# Patient Record
Sex: Female | Born: 1935 | Race: White | Hispanic: No | Marital: Single | Smoking: Former smoker
Health system: Southern US, Community
[De-identification: ages and names within clinical notes are randomized; demographics above are authoritative.]

## PROBLEM LIST (undated history)

## (undated) DIAGNOSIS — I10 Essential (primary) hypertension: Secondary | ICD-10-CM

## (undated) DIAGNOSIS — F0390 Unspecified dementia without behavioral disturbance: Secondary | ICD-10-CM

## (undated) DIAGNOSIS — M1990 Unspecified osteoarthritis, unspecified site: Secondary | ICD-10-CM

## (undated) HISTORY — PX: CHOLECYSTECTOMY: SHX55

---

## 2018-03-13 ENCOUNTER — Encounter (HOSPITAL_COMMUNITY): Payer: Self-pay

## 2018-03-13 ENCOUNTER — Inpatient Hospital Stay (HOSPITAL_COMMUNITY)
Admission: EM | Admit: 2018-03-13 | Discharge: 2018-03-19 | DRG: 065 | Disposition: A | Discharge status: Skilled Nursing Facility | Payer: Medicare Other | Attending: Internal Medicine | Admitting: Internal Medicine

## 2018-03-13 ENCOUNTER — Emergency Department (HOSPITAL_COMMUNITY): Payer: Medicare Other

## 2018-03-13 DIAGNOSIS — E785 Hyperlipidemia, unspecified: Secondary | ICD-10-CM | POA: Diagnosis present

## 2018-03-13 DIAGNOSIS — I6340 Cerebral infarction due to embolism of unspecified cerebral artery: Principal | ICD-10-CM | POA: Diagnosis present

## 2018-03-13 DIAGNOSIS — R4182 Altered mental status, unspecified: Secondary | ICD-10-CM | POA: Diagnosis present

## 2018-03-13 DIAGNOSIS — L8990 Pressure ulcer of unspecified site, unspecified stage: Secondary | ICD-10-CM

## 2018-03-13 DIAGNOSIS — R29702 NIHSS score 2: Secondary | ICD-10-CM | POA: Diagnosis present

## 2018-03-13 DIAGNOSIS — Z87891 Personal history of nicotine dependence: Secondary | ICD-10-CM

## 2018-03-13 DIAGNOSIS — I639 Cerebral infarction, unspecified: Secondary | ICD-10-CM

## 2018-03-13 DIAGNOSIS — G4710 Hypersomnia, unspecified: Secondary | ICD-10-CM | POA: Diagnosis present

## 2018-03-13 DIAGNOSIS — Z66 Do not resuscitate: Secondary | ICD-10-CM | POA: Diagnosis present

## 2018-03-13 DIAGNOSIS — L89312 Pressure ulcer of right buttock, stage 2: Secondary | ICD-10-CM | POA: Diagnosis present

## 2018-03-13 DIAGNOSIS — R400 Somnolence: Secondary | ICD-10-CM

## 2018-03-13 DIAGNOSIS — Q211 Atrial septal defect: Secondary | ICD-10-CM

## 2018-03-13 DIAGNOSIS — I10 Essential (primary) hypertension: Secondary | ICD-10-CM

## 2018-03-13 DIAGNOSIS — Z79899 Other long term (current) drug therapy: Secondary | ICD-10-CM

## 2018-03-13 DIAGNOSIS — Z7982 Long term (current) use of aspirin: Secondary | ICD-10-CM

## 2018-03-13 DIAGNOSIS — F0390 Unspecified dementia without behavioral disturbance: Secondary | ICD-10-CM

## 2018-03-13 HISTORY — DX: Essential (primary) hypertension: I10

## 2018-03-13 HISTORY — DX: Unspecified osteoarthritis, unspecified site: M19.90

## 2018-03-13 HISTORY — DX: Unspecified dementia, unspecified severity, without behavioral disturbance, psychotic disturbance, mood disturbance, and anxiety: F03.90

## 2018-03-13 LAB — I-STAT TROPONIN, ED: Troponin i, poc: 0.05 ng/mL (ref 0.00–0.08)

## 2018-03-13 LAB — CBC WITH DIFFERENTIAL/PLATELET
ABS IMMATURE GRANULOCYTES: 0.05 10*3/uL (ref 0.00–0.07)
BASOS ABS: 0 10*3/uL (ref 0.0–0.1)
Basophils Relative: 0 %
Eosinophils Absolute: 0.1 10*3/uL (ref 0.0–0.5)
Eosinophils Relative: 1 %
HCT: 38.9 % (ref 36.0–46.0)
HEMOGLOBIN: 12.8 g/dL (ref 12.0–15.0)
IMMATURE GRANULOCYTES: 0 %
LYMPHS ABS: 2.6 10*3/uL (ref 0.7–4.0)
LYMPHS PCT: 22 %
MCH: 31.7 pg (ref 26.0–34.0)
MCHC: 32.9 g/dL (ref 30.0–36.0)
MCV: 96.3 fL (ref 80.0–100.0)
MONOS PCT: 10 %
Monocytes Absolute: 1.1 10*3/uL — ABNORMAL HIGH (ref 0.1–1.0)
NEUTROS PCT: 67 %
NRBC: 0 % (ref 0.0–0.2)
Neutro Abs: 7.5 10*3/uL (ref 1.7–7.7)
Platelets: 312 10*3/uL (ref 150–400)
RBC: 4.04 MIL/uL (ref 3.87–5.11)
RDW: 12.4 % (ref 11.5–15.5)
WBC: 11.4 10*3/uL — ABNORMAL HIGH (ref 4.0–10.5)

## 2018-03-13 LAB — URINALYSIS, ROUTINE W REFLEX MICROSCOPIC
Bilirubin Urine: NEGATIVE
GLUCOSE, UA: NEGATIVE mg/dL
Hgb urine dipstick: NEGATIVE
Ketones, ur: NEGATIVE mg/dL
Leukocytes, UA: NEGATIVE
Nitrite: NEGATIVE
PROTEIN: NEGATIVE mg/dL
Specific Gravity, Urine: 1.012 (ref 1.005–1.030)
pH: 6 (ref 5.0–8.0)

## 2018-03-13 LAB — COMPREHENSIVE METABOLIC PANEL
ALBUMIN: 3.5 g/dL (ref 3.5–5.0)
ALT: 18 U/L (ref 0–44)
ANION GAP: 9 (ref 5–15)
AST: 19 U/L (ref 15–41)
Alkaline Phosphatase: 65 U/L (ref 38–126)
BILIRUBIN TOTAL: 0.5 mg/dL (ref 0.3–1.2)
BUN: 19 mg/dL (ref 8–23)
CO2: 23 mmol/L (ref 22–32)
Calcium: 9.1 mg/dL (ref 8.9–10.3)
Chloride: 106 mmol/L (ref 98–111)
Creatinine, Ser: 0.98 mg/dL (ref 0.44–1.00)
GFR calc Af Amer: >60 mL/min (ref >60)
GFR calc non Af Amer: 53 mL/min — ABNORMAL LOW (ref >60)
GLUCOSE: 109 mg/dL — AB (ref 70–99)
POTASSIUM: 4.4 mmol/L (ref 3.5–5.1)
SODIUM: 138 mmol/L (ref 135–145)
TOTAL PROTEIN: 7 g/dL (ref 6.5–8.1)

## 2018-03-13 LAB — CBG MONITORING, ED: Glucose-Capillary: 99 mg/dL (ref 70–99)

## 2018-03-13 LAB — PROTIME-INR
INR: 1.01
Prothrombin Time: 13.2 seconds (ref 11.4–15.2)

## 2018-03-13 MED ORDER — ENOXAPARIN SODIUM 40 MG/0.4ML ~~LOC~~ SOLN
40.0000 mg | SUBCUTANEOUS | Status: DC
  Administered 2018-03-14 – 2018-03-18 (×4): 40 mg via SUBCUTANEOUS
  Filled 2018-03-13 (×5): qty 0.4

## 2018-03-13 MED ORDER — ACETAMINOPHEN 650 MG RE SUPP: 650.0000 mg | Freq: Four times a day (QID) | RECTAL | Status: DC | PRN

## 2018-03-13 MED ORDER — ACETAMINOPHEN 325 MG PO TABS
650.0000 mg | ORAL_TABLET | Freq: Four times a day (QID) | ORAL | Status: DC | PRN
  Administered 2018-03-15: 650 mg via ORAL
  Filled 2018-03-13 (×3): qty 2

## 2018-03-13 MED ORDER — ASPIRIN EC 81 MG PO TBEC
81.0000 mg | DELAYED_RELEASE_TABLET | Freq: Every day | ORAL | Status: DC
  Administered 2018-03-14: 81 mg via ORAL
  Filled 2018-03-13: qty 1

## 2018-03-13 MED ORDER — SODIUM CHLORIDE 0.9% FLUSH
3.0000 mL | Freq: Two times a day (BID) | INTRAVENOUS | Status: DC
  Administered 2018-03-14 – 2018-03-18 (×9): 3 mL via INTRAVENOUS

## 2018-03-13 MED ORDER — SODIUM CHLORIDE 0.9 % IV SOLN
INTRAVENOUS | Status: AC
  Administered 2018-03-13: via INTRAVENOUS

## 2018-03-13 MED ORDER — SODIUM CHLORIDE 0.9 % IV BOLUS
1000.0000 mL | Freq: Once | INTRAVENOUS | Status: AC
  Administered 2018-03-13: 1000 mL via INTRAVENOUS

## 2018-03-13 NOTE — ED Provider Notes (Signed)
[facility] EMERGENCY DEPARTMENT Provider Note   CSN: [id number] Arrival date & time: 03/13/18  1550     History   Chief Complaint No chief complaint on file.   HPI Nina Bell is a 83 y.o. female.  Level 5 caveat limited historian secondary to altered mental status.  Patient reportedly had a fall at her facility yesterday.  Since then she is been altered and not been ambulatory.  Patient herself denies any complaints and when asked why she was here she said to get some blood work done.  She does not recall the fall.  She is oriented to the fact that it is Thursday and Halloween.  The history is provided by the patient and the EMS personnel. The history is limited by the condition of the patient.  Altered Mental Status   This is a new problem. The current episode started yesterday. The problem has not changed since onset.Associated symptoms include confusion.    No past medical history on file.  There are no active problems to display for this patient.   Past Surgical History:  Procedure Laterality Date  . CHOLECYSTECTOMY       OB History   None      Home Medications    Prior to Admission medications   Not on File    Family History No family history on file.  Social History Social History   Tobacco Use  . Smoking status: Not on file  Substance Use Topics  . Alcohol use: Not on file  . Drug use: Not on file     Allergies   Patient has no allergy information on record.   Review of Systems Review of Systems  Unable to perform ROS: Mental status change  Constitutional: Negative for fever.  HENT: Negative for sore throat.   Eyes: Negative for visual disturbance.  Respiratory: Negative for shortness of breath.   Cardiovascular: Negative for chest pain.  Gastrointestinal: Negative for abdominal pain.  Genitourinary: Negative for dysuria.  Skin: Negative for rash.  Psychiatric/Behavioral: Positive for confusion.     Physical  Exam Updated Vital Signs BP (!) 145/53   Pulse 86   Temp 98.9 F (37.2 C) (Oral)   Resp 20   SpO2 99%   Physical Exam  Constitutional: She appears well-developed and well-nourished. No distress.  HENT:  Head: Normocephalic and atraumatic.  Right Ear: External ear normal.  Left Ear: External ear normal.  Nose: Nose normal.  Mouth/Throat: Oropharynx is clear and moist.  Eyes: Conjunctivae are normal.  Neck: Neck supple.  Cardiovascular: Normal rate, regular rhythm, normal heart sounds and intact distal pulses.  No murmur heard. Pulmonary/Chest: Effort normal and breath sounds normal. No respiratory distress.  Abdominal: Soft. She exhibits no mass. There is no tenderness. There is no guarding.  Musculoskeletal: Normal range of motion. She exhibits no edema, tenderness or deformity.  Neurological: She is alert. She has normal strength. She is disoriented (to place). No sensory deficit. GCS eye subscore is 4. GCS verbal subscore is 5. GCS motor subscore is 6.  Skin: Skin is warm and dry. Capillary refill takes less than 2 seconds.  Psychiatric: She has a normal mood and affect.  Nursing note and vitals reviewed.    ED Treatments / Results  Labs (all labs ordered are listed, but only abnormal results are displayed) Labs Reviewed  CBC WITH DIFFERENTIAL/PLATELET - Abnormal; Notable for the following components:      Result Value   WBC 11.4 (*)  Monocytes Absolute 1.1 (*)    All other components within normal limits  COMPREHENSIVE METABOLIC PANEL - Abnormal; Notable for the following components:   Glucose, Bld 109 (*)    GFR calc non Af Amer 53 (*)    All other components within normal limits  TROPONIN I - Abnormal; Notable for the following components:   Troponin I 0.07 (*)    All other components within normal limits  CBC - Abnormal; Notable for the following components:   RBC 3.65 (*)    Hemoglobin 11.4 (*)    HCT 34.3 (*)    All other components within normal limits    BASIC METABOLIC PANEL - Abnormal; Notable for the following components:   Calcium 8.3 (*)    All other components within normal limits  TROPONIN I - Abnormal; Notable for the following components:   Troponin I 0.06 (*)    All other components within normal limits  MRSA PCR SCREENING  URINALYSIS, ROUTINE W REFLEX MICROSCOPIC  PROTIME-INR  TSH  CBG MONITORING, ED  I-STAT TROPONIN, ED    EKG EKG Interpretation  Date/Time:  Thursday March 13 2018 15:54:29 EDT Ventricular Rate:  86 PR Interval:    QRS Duration: 86 QT Interval:  390 QTC Calculation: 467 R Axis:   -6 Text Interpretation:  Sinus rhythm Anterior infarct, old Minimal ST depression, lateral leads no prior to compare with Confirmed by Meridee Score [id number]) on 03/13/2018 4:07:38 PM   Radiology Dg Chest 1 View  Result Date: 03/13/2018 CLINICAL DATA:  Altered mental status, fell yesterday, disorientation EXAM: CHEST  1 VIEW COMPARISON:  None. FINDINGS: No active infiltrate or effusion is seen. Mediastinal and hilar contours are unremarkable. The heart is mildly enlarged. No bony abnormality is seen. A small hiatal hernia cannot be excluded. IMPRESSION: 1. No active lung disease. 2. Borderline cardiomegaly. Electronically Signed   By: Dwyane Dee M.D.   On: 03/13/2018 17:04   Ct Head Wo Contrast  Result Date: 03/13/2018 CLINICAL DATA:  Fall EXAM: CT HEAD WITHOUT CONTRAST CT CERVICAL SPINE WITHOUT CONTRAST TECHNIQUE: Multidetector CT imaging of the head and cervical spine was performed following the standard protocol without intravenous contrast. Multiplanar CT image reconstructions of the cervical spine were also generated. COMPARISON:  None. FINDINGS: CT HEAD FINDINGS Brain: There is no mass, hemorrhage or extra-axial collection. There is generalized atrophy without lobar predilection. There is hypoattenuation of the periventricular white matter, most commonly indicating chronic ischemic microangiopathy. Vascular:  Atherosclerotic calcification of the internal carotid arteries at the skull base. No abnormal hyperdensity of the major intracranial arteries or dural venous sinuses. Skull: The visualized skull base, calvarium and extracranial soft tissues are normal. Sinuses/Orbits: No fluid levels or advanced mucosal thickening of the visualized paranasal sinuses. No mastoid or middle ear effusion. The orbits are normal. CT CERVICAL SPINE FINDINGS Alignment: No static subluxation. Facets are aligned. Occipital condyles are normally positioned. Skull base and vertebrae: No acute fracture. Soft tissues and spinal canal: No prevertebral fluid or swelling. No visible canal hematoma. Disc levels: No advanced spinal canal or neural foraminal stenosis. Upper chest: No pneumothorax, pulmonary nodule or pleural effusion. Other: Normal visualized paraspinal cervical soft tissues. IMPRESSION: 1. No acute abnormality of the head or cervical spine. 2. Chronic small vessel ischemia and cerebral volume loss. Electronically Signed   By: Deatra Robinson M.D.   On: 03/13/2018 17:58   Ct Cervical Spine Wo Contrast  Result Date: 03/13/2018 CLINICAL DATA:  Fall EXAM: CT HEAD WITHOUT CONTRAST CT  CERVICAL SPINE WITHOUT CONTRAST TECHNIQUE: Multidetector CT imaging of the head and cervical spine was performed following the standard protocol without intravenous contrast. Multiplanar CT image reconstructions of the cervical spine were also generated. COMPARISON:  None. FINDINGS: CT HEAD FINDINGS Brain: There is no mass, hemorrhage or extra-axial collection. There is generalized atrophy without lobar predilection. There is hypoattenuation of the periventricular white matter, most commonly indicating chronic ischemic microangiopathy. Vascular: Atherosclerotic calcification of the internal carotid arteries at the skull base. No abnormal hyperdensity of the major intracranial arteries or dural venous sinuses. Skull: The visualized skull base, calvarium and  extracranial soft tissues are normal. Sinuses/Orbits: No fluid levels or advanced mucosal thickening of the visualized paranasal sinuses. No mastoid or middle ear effusion. The orbits are normal. CT CERVICAL SPINE FINDINGS Alignment: No static subluxation. Facets are aligned. Occipital condyles are normally positioned. Skull base and vertebrae: No acute fracture. Soft tissues and spinal canal: No prevertebral fluid or swelling. No visible canal hematoma. Disc levels: No advanced spinal canal or neural foraminal stenosis. Upper chest: No pneumothorax, pulmonary nodule or pleural effusion. Other: Normal visualized paraspinal cervical soft tissues. IMPRESSION: 1. No acute abnormality of the head or cervical spine. 2. Chronic small vessel ischemia and cerebral volume loss. Electronically Signed   By: Deatra Robinson M.D.   On: 03/13/2018 17:58   Mr Brain Wo Contrast  Result Date: 03/14/2018 CLINICAL DATA:  Initial evaluation for acute altered mental status. The EXAM: MRI HEAD WITHOUT CONTRAST TECHNIQUE: Multiplanar, multiecho pulse sequences of the brain and surrounding structures were obtained without intravenous contrast. COMPARISON:  Prior CT from 03/13/2018. FINDINGS: Brain: Examination mildly degraded by motion artifact. Diffuse prominence of the CSF containing spaces compatible with generalized cerebral atrophy, mildly advanced in nature. Confluent T2/FLAIR hyperintensity within the periventricular white matter most consistent with chronic small vessel ischemic disease, mild for age. There is minimal patchy diffusion abnormality involving the cortical and subcortical white matter within the right frontal lobe (series 5, image 81), consistent with small acute right MCA territory infarcts. A few additional punctate foci of diffusion abnormality within the bilateral cerebellar hemispheres (series 5, image 54, 53, 46), also consistent with acute/early subacute ischemic infarcts. Findings are likely embolic in  nature. No associated hemorrhage or mass effect. No other evidence for acute or subacute ischemia. Gray-white matter differentiation otherwise maintained. No acute or chronic intracranial hemorrhage. No mass lesion, midline shift or mass effect. Diffuse ventricular prominence most like related global parenchymal volume loss. A degree of underlying NPH would be difficult to exclude. No extra-axial fluid collection. Pituitary gland within normal limits. Vascular: Major intracranial vascular flow voids maintained. Skull and upper cervical spine: Craniocervical junction normal. Upper cervical spine within normal limits. Bone marrow signal intensity normal. No scalp soft tissue abnormality. Sinuses/Orbits: Patient status post ocular lens replacement bilaterally. Paranasal sinuses are clear. Trace opacity left mastoid air cells, of doubtful significance. Other: End. IMPRESSION: 1. Few scattered patchy small volume acute to early subacute ischemic infarcts involving the cortical subcortical right frontal lobe as well as the bilateral cerebellar hemispheres as above. Findings likely embolic in nature. No associated hemorrhage. 2. No other acute intracranial abnormality. 3. Mildly advanced cerebral atrophy with chronic small vessel ischemic disease. 4. Diffuse ventriculomegaly. While this finding may be related to underlying global atrophy, a component of NPH may be contributory, and could be considered in the correct clinical setting. Electronically Signed   By: Rise Mu M.D.   On: 03/14/2018 05:57    Procedures Procedures (  including critical care time)  Medications Ordered in ED Medications  sodium chloride 0.9 % bolus 1,000 mL (has no administration in time range)     Initial Impression / Assessment and Plan / ED Course  I have reviewed the triage vital signs and the nursing notes.  Pertinent labs & imaging results that were available during my care of the patient were reviewed by me and  considered in my medical decision making (see chart for details).  Clinical Course as of Mar 14 1126  Thu Mar 13, 2018  1706 Patient's daughter is here and she is able to give a little bit more history.  It sounds like she has had more big decline over the past 3 weeks and has been more forgetful and had difficulty with tasks.  She uses a wheelchair at baseline.  Since the fall yesterday which she is really noticed today is her inability to stay awake and the fact that she is more leaning towards her right side.  Cognition wise she does not feel there is a big difference but it is more of the level of alertness.   [MB]  2219 patient's initial lab work-up and CT imaging of been unremarkable.  She still seems uncomfortable and has pulled out her IV here.  Her daughter is uncomfortable with the idea of sending her back to her facility without a more clear answer.   [MB]    Clinical Course User Index [MB] Terrilee Files, MD      Final Clinical Impressions(s) / ED Diagnoses   Final diagnoses:  Somnolence    ED Discharge Orders    None       Terrilee Files, MD 03/14/18 1129

## 2018-03-13 NOTE — ED Triage Notes (Signed)
[facility] reports pt AMS x2 days. Pt fell on 10/30 at 7:00 am unwitnessed. Pt is leaning to the right. Neg stroke screen, no blood thinners. Pt is norm A&0 x4 and ambulates with walker. Now she is A&0 x 2.

## 2018-03-13 NOTE — H&P (Signed)
History and Physical    Nina Bell ZOX:[id number] DOB: [birth year] DOA: 03/13/2018  PCP: Florentina Jenny, MD  Patient coming from: [facility]  I have personally briefly reviewed patient's old medical records in [facility]  Chief Complaint: acute change in functional and mental status  HPI: Nina Bell is a 83 y.o. female with medical history significant for Dementia, HTN, and arthritis who presents to the ED with change in mental status over the last 2 days.  Daughter is at bedside who provides majority of history as patient is only able to answer occasional yes/no questions and was gives incorrect information per daughter.  Patient reportedly had a witnessed fall in facility the day prior to admission without injury.  Since then the daughter and facility staff noticed that she had a notable change in her mental status and functional status.  Daughter notices that patient has been leaning more towards the right and has been very somnolent over the last day.  She has also noticed a change in facial movement as if the patient is constantly "puckering" her lips.  She has had occasional forgetfulness.  Normally patient is completely alert and oriented.  She is previously able to ambulate with a walker, now transports with a wheelchair and that she seems like she has difficulty understanding safe use of the walker.  Patient herself reports headache, chest pain, and back pain.  She denies having a depressed mood.  She says she has lost interest in acting (daughter states patient has never acted).  There has been no recent changes in her medications.  Daughter states patient has a history of unspecified dementia.  ED Course:  BP 145/53, pulse 86, RR 20, temp 98.81F, SPO2 99% on room air. WBC 11.4, CBC and CMP otherwise largely unremarkable. Urinalysis negative for UTI. Troponin 0.07  EKG showed normal sinus rhythm with Q waves leads III, aVF, V3 and no acute  ischemic changes.  Chest x-ray without any acute cardiopulmonary process. CT head and cervical spine without contrast without any acute or abnormality, did show chronic small vessel ischemia with cerebral volume loss.  Review of Systems: As per HPI otherwise 10 point review of systems negative.    Past Medical History:  Diagnosis Date  . Arthritis   . Dementia (HCC)   . Hypertension     Past Surgical History:  Procedure Laterality Date  . CHOLECYSTECTOMY       reports that she quit smoking about 29 years ago. She has never used smokeless tobacco. She reports that she does not drink alcohol or use drugs.  Allergies  Allergen Reactions  . Penicillins Other (See Comments)    unknown    Family History  Problem Relation Age of Onset  . Stomach cancer Mother   . Hypertension Mother      Prior to Admission medications   Medication Sig Start Date End Date Taking? Authorizing Provider  acetaminophen (TYLENOL) 325 MG tablet Take 650 mg by mouth daily as needed for mild pain.   Yes [provider]  aspirin 81 MG chewable tablet Chew 81 mg by mouth daily.   Yes [provider]  atenolol (TENORMIN) 50 MG tablet Take 50 mg by mouth daily.   Yes [provider]  losartan (COZAAR) 50 MG tablet Take 50 mg by mouth daily.   Yes [provider]  Multiple Vitamin (MULTIVITAMIN WITH MINERALS) TABS tablet Take 1 tablet by mouth daily.   Yes [provider]  psyllium (REGULOID) 0.52 g capsule Take 0.52 g by mouth at bedtime.   Yes [provider]    Physical Exam: Vitals:   03/13/18 2345 03/14/18 0100 03/14/18 0118 03/14/18 0144  BP: (!) 162/74  (!) 186/61 (!) 167/53  Pulse: 89  79 81  Resp: (!) 23  (!) 22   Temp:   97.8 F (36.6 C)   TempSrc:   Oral   SpO2: 97%  97%   Weight:  59 kg    Height:  5\' 5"  (1.651 m)     Exam limited due to patient cooperation/understanding. Constitutional: resting in bad, NAD, calm, comfortable,  intermittently falling asleep Eyes: PERRL, lids and conjunctivae normal ENMT: Mucous membranes are dry. Posterior pharynx clear of any exudate or lesions. Poor dentition. Neck: normal, supple, no masses, no thyromegaly Respiratory: fine crackles bilaterally. Normal respiratory effort. No accessory muscle use.  Cardiovascular: Regular rate and rhythm, no murmurs / rubs / gallops. No extremity edema. 2+ pedal pulses. Abdomen: no tenderness, no masses palpated. No hepatosplenomegaly. Bowel sounds positive.  Musculoskeletal: no clubbing / cyanosis. No joint deformity upper and lower extremities. Good ROM, no contractures. Normal muscle tone.  Skin: no rashes, lesions, ulcers. No induration Neurologic: CN 2-12 grossly intact. Sensation intact. Unable to perform FNF due to understanding? Strength 5/5 in all 4 except 4/5 LLE with extension at the hip.  Psychiatric: Intermittently somnolent, but awakens and becomes alert with light stimulation, oriented to person. Denies hallucinations or depression. Reports loss of interest in acting (daughter states pt never acted). Randomly reaches up with right arm as if she is trying to grasp an object.    Labs on Admission: I have personally reviewed following labs and imaging studies  CBC: Recent Labs  Lab 03/13/18 1806  WBC 11.4*  NEUTROABS 7.5  HGB 12.8  HCT 38.9  MCV 96.3  PLT 312   Basic Metabolic Panel: Recent Labs  Lab 03/13/18 1806  NA 138  K 4.4  CL 106  CO2 23  GLUCOSE 109*  BUN 19  CREATININE 0.98  CALCIUM 9.1   GFR: Estimated Creatinine Clearance: 41.2 mL/min (by C-G formula based on SCr of 0.98 mg/dL). Liver Function Tests: Recent Labs  Lab 03/13/18 1806  AST 19  ALT 18  ALKPHOS 65  BILITOT 0.5  PROT 7.0  ALBUMIN 3.5   No results for input(s): LIPASE, AMYLASE in the last 168 hours. No results for input(s): AMMONIA in the last 168 hours. Coagulation Profile: Recent Labs  Lab 03/13/18 1806  INR 1.01   Cardiac  Enzymes: Recent Labs  Lab 03/13/18 2350  TROPONINI 0.07*   BNP (last 3 results) No results for input(s): PROBNP in the last 8760 hours. HbA1C: No results for input(s): HGBA1C in the last 72 hours. CBG: Recent Labs  Lab 03/13/18 1804  GLUCAP 99   Lipid Profile: No results for input(s): CHOL, HDL, LDLCALC, TRIG, CHOLHDL, LDLDIRECT in the last 72 hours. Thyroid Function Tests: Recent Labs    03/13/18 2350  TSH 1.064   Anemia Panel: No results for input(s): VITAMINB12, FOLATE, FERRITIN, TIBC, IRON, RETICCTPCT in the last 72 hours. Urine analysis:    Component Value Date/Time   COLORURINE YELLOW 03/13/2018 2135   APPEARANCEUR CLEAR 03/13/2018 2135   LABSPEC 1.012 03/13/2018 2135   PHURINE 6.0 03/13/2018 2135   GLUCOSEU NEGATIVE 03/13/2018 2135   HGBUR NEGATIVE 03/13/2018 2135   BILIRUBINUR NEGATIVE 03/13/2018 2135   KETONESUR NEGATIVE 03/13/2018 2135   PROTEINUR NEGATIVE 03/13/2018 2135  NITRITE NEGATIVE 03/13/2018 2135   LEUKOCYTESUR NEGATIVE 03/13/2018 2135    Radiological Exams on Admission: Dg Chest 1 View  Result Date: 03/13/2018 CLINICAL DATA:  Altered mental status, fell yesterday, disorientation EXAM: CHEST  1 VIEW COMPARISON:  None. FINDINGS: No active infiltrate or effusion is seen. Mediastinal and hilar contours are unremarkable. The heart is mildly enlarged. No bony abnormality is seen. A small hiatal hernia cannot be excluded. IMPRESSION: 1. No active lung disease. 2. Borderline cardiomegaly. Electronically Signed   By: Dwyane Dee M.D.   On: 03/13/2018 17:04   Ct Head Wo Contrast  Result Date: 03/13/2018 CLINICAL DATA:  Fall EXAM: CT HEAD WITHOUT CONTRAST CT CERVICAL SPINE WITHOUT CONTRAST TECHNIQUE: Multidetector CT imaging of the head and cervical spine was performed following the standard protocol without intravenous contrast. Multiplanar CT image reconstructions of the cervical spine were also generated. COMPARISON:  None. FINDINGS: CT HEAD FINDINGS  Brain: There is no mass, hemorrhage or extra-axial collection. There is generalized atrophy without lobar predilection. There is hypoattenuation of the periventricular white matter, most commonly indicating chronic ischemic microangiopathy. Vascular: Atherosclerotic calcification of the internal carotid arteries at the skull base. No abnormal hyperdensity of the major intracranial arteries or dural venous sinuses. Skull: The visualized skull base, calvarium and extracranial soft tissues are normal. Sinuses/Orbits: No fluid levels or advanced mucosal thickening of the visualized paranasal sinuses. No mastoid or middle ear effusion. The orbits are normal. CT CERVICAL SPINE FINDINGS Alignment: No static subluxation. Facets are aligned. Occipital condyles are normally positioned. Skull base and vertebrae: No acute fracture. Soft tissues and spinal canal: No prevertebral fluid or swelling. No visible canal hematoma. Disc levels: No advanced spinal canal or neural foraminal stenosis. Upper chest: No pneumothorax, pulmonary nodule or pleural effusion. Other: Normal visualized paraspinal cervical soft tissues. IMPRESSION: 1. No acute abnormality of the head or cervical spine. 2. Chronic small vessel ischemia and cerebral volume loss. Electronically Signed   By: Deatra Robinson M.D.   On: 03/13/2018 17:58   Ct Cervical Spine Wo Contrast  Result Date: 03/13/2018 CLINICAL DATA:  Fall EXAM: CT HEAD WITHOUT CONTRAST CT CERVICAL SPINE WITHOUT CONTRAST TECHNIQUE: Multidetector CT imaging of the head and cervical spine was performed following the standard protocol without intravenous contrast. Multiplanar CT image reconstructions of the cervical spine were also generated. COMPARISON:  None. FINDINGS: CT HEAD FINDINGS Brain: There is no mass, hemorrhage or extra-axial collection. There is generalized atrophy without lobar predilection. There is hypoattenuation of the periventricular white matter, most commonly indicating chronic  ischemic microangiopathy. Vascular: Atherosclerotic calcification of the internal carotid arteries at the skull base. No abnormal hyperdensity of the major intracranial arteries or dural venous sinuses. Skull: The visualized skull base, calvarium and extracranial soft tissues are normal. Sinuses/Orbits: No fluid levels or advanced mucosal thickening of the visualized paranasal sinuses. No mastoid or middle ear effusion. The orbits are normal. CT CERVICAL SPINE FINDINGS Alignment: No static subluxation. Facets are aligned. Occipital condyles are normally positioned. Skull base and vertebrae: No acute fracture. Soft tissues and spinal canal: No prevertebral fluid or swelling. No visible canal hematoma. Disc levels: No advanced spinal canal or neural foraminal stenosis. Upper chest: No pneumothorax, pulmonary nodule or pleural effusion. Other: Normal visualized paraspinal cervical soft tissues. IMPRESSION: 1. No acute abnormality of the head or cervical spine. 2. Chronic small vessel ischemia and cerebral volume loss. Electronically Signed   By: Deatra Robinson M.D.   On: 03/13/2018 17:58    EKG: Independently reviewed. Normal  sinus rhythm with Q waves leads III, aVF, V3 and no acute ischemic changes.  Assessment/Plan Principal Problem:   Altered mental status Active Problems:   Hypertension   Dementia (HCC)   Nina Bell is a 83 y.o. female with medical history significant for Dementia, HTN, and arthritis who presents to the ED with change in mental and functional status over the last 2 days after a fall.  Altered mental/functional status with history of Dementia: Most notable changes reportedly occurring after a recent fall. Previously, patient able to converse well and was completely alert and oriented. Now patient with change in gait, facial movements, memory, and hypersomnolence. Unclear whether she has had progressive dementia symptoms that are now being more easily recognized. So far workup is  negative for reversible cause. Troponin is mildly elevated at 0.07. No focal deficit on exam, however exam is limited due to cooperation. Depression is also a possible etiology. -MRI Brain -Check TSH -Trend Troponin -Continue IVF overnight  Hypertension: Takes Losartan 50 mg daily and Atenolol 50 mg daily at home. -Holding home meds pending MRI Brain; restart both if negative for stroke  DVT prophylaxis: lovenox  Code Status: DNR per daughter  Family Communication: Discussed with daughter Amy at bedside Disposition Plan: Likely d/c back to [facility] in 1 day pending above workup. Consults called: none  Admission status: observation    Darreld Mclean MD [facility] Pager [phone]  If 7PM-7AM, please contact night-coverage [email or web address] Password [email or web address]  03/14/2018, 2:32 AM

## 2018-03-13 NOTE — ED Notes (Signed)
IV team at bedside 

## 2018-03-13 NOTE — ED Notes (Signed)
Changed pt and bed. Placed on purwick to get urine. Will in/out if no urine is produced.

## 2018-03-14 ENCOUNTER — Ambulatory Visit (HOSPITAL_COMMUNITY): Payer: Medicare Other

## 2018-03-14 ENCOUNTER — Other Ambulatory Visit: Payer: Self-pay

## 2018-03-14 ENCOUNTER — Inpatient Hospital Stay (HOSPITAL_COMMUNITY): Payer: Medicare Other

## 2018-03-14 ENCOUNTER — Observation Stay (HOSPITAL_COMMUNITY): Payer: Medicare Other

## 2018-03-14 ENCOUNTER — Ambulatory Visit (HOSPITAL_BASED_OUTPATIENT_CLINIC_OR_DEPARTMENT_OTHER): Payer: Medicare Other

## 2018-03-14 DIAGNOSIS — F0390 Unspecified dementia without behavioral disturbance: Secondary | ICD-10-CM | POA: Diagnosis present

## 2018-03-14 DIAGNOSIS — I059 Rheumatic mitral valve disease, unspecified: Secondary | ICD-10-CM | POA: Diagnosis not present

## 2018-03-14 DIAGNOSIS — I639 Cerebral infarction, unspecified: Secondary | ICD-10-CM

## 2018-03-14 DIAGNOSIS — I6340 Cerebral infarction due to embolism of unspecified cerebral artery: Secondary | ICD-10-CM | POA: Diagnosis present

## 2018-03-14 DIAGNOSIS — R4182 Altered mental status, unspecified: Secondary | ICD-10-CM | POA: Diagnosis present

## 2018-03-14 DIAGNOSIS — L8990 Pressure ulcer of unspecified site, unspecified stage: Secondary | ICD-10-CM

## 2018-03-14 DIAGNOSIS — E785 Hyperlipidemia, unspecified: Secondary | ICD-10-CM | POA: Diagnosis present

## 2018-03-14 DIAGNOSIS — Q211 Atrial septal defect: Secondary | ICD-10-CM | POA: Diagnosis not present

## 2018-03-14 DIAGNOSIS — M7989 Other specified soft tissue disorders: Secondary | ICD-10-CM | POA: Diagnosis not present

## 2018-03-14 DIAGNOSIS — G309 Alzheimer's disease, unspecified: Secondary | ICD-10-CM | POA: Diagnosis not present

## 2018-03-14 DIAGNOSIS — Z87891 Personal history of nicotine dependence: Secondary | ICD-10-CM | POA: Diagnosis not present

## 2018-03-14 DIAGNOSIS — Z66 Do not resuscitate: Secondary | ICD-10-CM | POA: Diagnosis present

## 2018-03-14 DIAGNOSIS — Z7982 Long term (current) use of aspirin: Secondary | ICD-10-CM | POA: Diagnosis not present

## 2018-03-14 DIAGNOSIS — G4710 Hypersomnia, unspecified: Secondary | ICD-10-CM | POA: Diagnosis present

## 2018-03-14 DIAGNOSIS — F0280 Dementia in other diseases classified elsewhere without behavioral disturbance: Secondary | ICD-10-CM | POA: Diagnosis not present

## 2018-03-14 DIAGNOSIS — Z79899 Other long term (current) drug therapy: Secondary | ICD-10-CM | POA: Diagnosis not present

## 2018-03-14 DIAGNOSIS — R29702 NIHSS score 2: Secondary | ICD-10-CM | POA: Diagnosis present

## 2018-03-14 DIAGNOSIS — I10 Essential (primary) hypertension: Secondary | ICD-10-CM | POA: Diagnosis present

## 2018-03-14 DIAGNOSIS — I5030 Unspecified diastolic (congestive) heart failure: Secondary | ICD-10-CM

## 2018-03-14 DIAGNOSIS — L89312 Pressure ulcer of right buttock, stage 2: Secondary | ICD-10-CM | POA: Diagnosis present

## 2018-03-14 LAB — CBC
HEMATOCRIT: 34.3 % — AB (ref 36.0–46.0)
HEMOGLOBIN: 11.4 g/dL — AB (ref 12.0–15.0)
MCH: 31.2 pg (ref 26.0–34.0)
MCHC: 33.2 g/dL (ref 30.0–36.0)
MCV: 94 fL (ref 80.0–100.0)
NRBC: 0 % (ref 0.0–0.2)
Platelets: 223 10*3/uL (ref 150–400)
RBC: 3.65 MIL/uL — AB (ref 3.87–5.11)
RDW: 12.1 % (ref 11.5–15.5)
WBC: 9.1 10*3/uL (ref 4.0–10.5)

## 2018-03-14 LAB — BASIC METABOLIC PANEL
ANION GAP: 5 (ref 5–15)
BUN: 12 mg/dL (ref 8–23)
CO2: 24 mmol/L (ref 22–32)
Calcium: 8.3 mg/dL — ABNORMAL LOW (ref 8.9–10.3)
Chloride: 110 mmol/L (ref 98–111)
Creatinine, Ser: 0.76 mg/dL (ref 0.44–1.00)
GFR calc non Af Amer: >60 mL/min (ref >60)
GLUCOSE: 96 mg/dL (ref 70–99)
POTASSIUM: 3.7 mmol/L (ref 3.5–5.1)
Sodium: 139 mmol/L (ref 135–145)

## 2018-03-14 LAB — MRSA PCR SCREENING: MRSA BY PCR: NEGATIVE

## 2018-03-14 LAB — ECHOCARDIOGRAM COMPLETE
Height: 65 in
Weight: 2080 oz

## 2018-03-14 LAB — TSH: TSH: 1.064 u[IU]/mL (ref 0.350–4.500)

## 2018-03-14 LAB — TROPONIN I
Troponin I: 0.07 ng/mL (ref <0.03)
Troponin I: 0.06 ng/mL (ref <0.03)

## 2018-03-14 MED ORDER — SODIUM CHLORIDE 0.9 % IV SOLN
INTRAVENOUS | Status: AC
  Administered 2018-03-14: 12:00:00 via INTRAVENOUS

## 2018-03-14 MED ORDER — STROKE: EARLY STAGES OF RECOVERY BOOK
Freq: Once | Status: AC
  Administered 2018-03-14: 12:00:00
  Filled 2018-03-14 (×2): qty 1

## 2018-03-14 MED ORDER — IOPAMIDOL (ISOVUE-370) INJECTION 76%
50.0000 mL | Freq: Once | INTRAVENOUS | Status: AC | PRN
  Administered 2018-03-14: 50 mL via INTRAVENOUS

## 2018-03-14 MED ORDER — IOPAMIDOL (ISOVUE-370) INJECTION 76%
INTRAVENOUS | Status: AC
  Administered 2018-03-15: 01:00:00
  Filled 2018-03-14: qty 50

## 2018-03-14 MED ORDER — ASPIRIN EC 325 MG PO TBEC
325.0000 mg | DELAYED_RELEASE_TABLET | Freq: Every day | ORAL | Status: DC
  Administered 2018-03-15 – 2018-03-17 (×3): 325 mg via ORAL
  Filled 2018-03-14 (×3): qty 1

## 2018-03-14 NOTE — Consult Note (Addendum)
NEURO HOSPITALIST  CONSULT     Requesting Physician: Dr. Benjamine Mola    Chief Complaint: acute change in functional and mental status  History obtained from:  Patient / Chart   HPI:                                                                                                                                         Nina Bell is an 83 y.o. female  With PMH significant for dementia, HTN, Arthritis who presented to [facility] 03/13/18  From [facility] with a change in mental status over the past 2 days.    Per chart review:  History was given by daughter who stated that the patient had a witnessed fall without injury 03/12/18 the day before admission to hospital. Per daughter there has been an increase in confusion over the past week. When she fell 2 days ago she had no memory of falling. Since the fall staff and daughter have a change in the patient's mental/functional status. Her OT noticed that she was placing deodorant on her hands as soap to wash her hands. She has not been as active as normal, and has been sleeping more.  Daughter has noticed that the patient has been more tired and has been tending to lean more to the right. Patient has been constantly "puckering" her lips and had occasional forgetfulness. At baseline patient was completely alert and oriented.   Patient uses a wheelchair at baseline. No previous stroke history. Per daughter patient is an unreliable historian. Diagnosed with dementia about 3 years ago.  Hospital course:  CT Head 03/13/18: no acute abnormality MRI: scattered patchy small volume acute to subacute ischemic infarcts involving cortical subcortical right frontal lobe as well as bilateral cerebellar hemispheres. BP: 160/51 BG: 96  Date last known well: Date: 03/11/2018 Time last known well: Unable to determine tPA Given: No: outside of window  Modified Rankin: Rankin Score=3 NIHSS:2; left arm  drift, age     Past Medical History:  Diagnosis Date  . Arthritis   . Dementia (HCC)   . Hypertension     Past Surgical History:  Procedure Laterality Date  . CHOLECYSTECTOMY      Family History  Problem Relation Age of Onset  . Stomach cancer Mother   . Hypertension Mother          Social History:  reports that she quit smoking about 29 years ago. She has never used smokeless tobacco. She reports that she does not drink alcohol or use drugs.  Allergies:  Allergies  Allergen Reactions  . Penicillins Other (See Comments)  unknown    Medications:                                                                                                                           Scheduled: .  stroke: mapping our early stages of recovery book   Does not apply Once  . aspirin EC  81 mg Oral Daily  . enoxaparin (LOVENOX) injection  40 mg Subcutaneous Q24H  . sodium chloride flush  3 mL Intravenous Q12H   Continuous: . sodium chloride 100 mL/hr at 03/13/18 2358   ZOX:[id number] **OR** acetaminophen   ROS:                                                                                                                                        unobtainable from patient due to mental status  General Examination:                                                                                                      Blood pressure (!) 158/48, pulse 66, temperature 98.2 F (36.8 C), temperature source Oral, resp. rate 18, height 5\' 5"  (1.651 m), weight 59 kg, SpO2 95 %.  HEENT-  Normocephalic, no lesions, without obvious abnormality.  Normal external eye and conjunctiva.  Cardiovascular- S1-S2 audible, pulses palpable throughout   Lungs-no rhonchi or wheezing noted, no excessive working breathing.  Saturations within normal limits Abdomen- All 4 quadrants palpated and nontender Extremities- Warm, dry and intact Musculoskeletal-no joint tenderness, deformity or swelling Skin-warm and  dry, no hyperpigmentation, vitiligo, or suspicious lesions  Neurological Examination Mental Status: Alert, oriented to person only/ stated year was 1980/ age was 30, thought content appropriate. No dysarthria noted. Speech fluent without evidence of aphasia.  Able to follow  commands without difficulty. Cranial Nerves: II:  Visual fields grossly normal,  III,IV, VI: ptosis not present, extra-ocular motions intact bilaterally ( did not follow the pen into fields of gaze, but would look  in the directions I asked her to look), pupils slightly unequal, both are, round, reactive to light  V,VII: smile symmetric, facial light touch sensation normal bilaterally VIII: hearing normal bilaterally IX,X: uvula rises symmetrically XI: bilateral shoulder shrug XII: midline tongue extension Motor: Right : Upper extremity   5/5 Left:     Upper extremity   4/5  Lower extremity   5/5  Lower extremity   4+/5 Left side weaker than the right. Tone and bulk:normal tone throughout; no atrophy noted Sensory: light touch intact throughout, bilaterally Deep Tendon Reflexes: 2+ and symmetric biceps and patella Plantars: Right: downgoing   Left: downgoing Cerebellar: normal finger-to-nose Gait: deferred   Lab Results: Basic Metabolic Panel: Recent Labs  Lab 03/13/18 1806 03/14/18 0531  NA 138 139  K 4.4 3.7  CL 106 110  CO2 23 24  GLUCOSE 109* 96  BUN 19 12  CREATININE 0.98 0.76  CALCIUM 9.1 8.3*    CBC: Recent Labs  Lab 03/13/18 1806 03/14/18 0531  WBC 11.4* 9.1  NEUTROABS 7.5  --   HGB 12.8 11.4*  HCT 38.9 34.3*  MCV 96.3 94.0  PLT 312 223   CBG: Recent Labs  Lab 03/13/18 1804  GLUCAP 99    Imaging: Dg Chest 1 View  Result Date: 03/13/2018 CLINICAL DATA:  Altered mental status, fell yesterday, disorientation EXAM: CHEST  1 VIEW COMPARISON:  None. FINDINGS: No active infiltrate or effusion is seen. Mediastinal and hilar contours are unremarkable. The heart is mildly enlarged. No  bony abnormality is seen. A small hiatal hernia cannot be excluded. IMPRESSION: 1. No active lung disease. 2. Borderline cardiomegaly. Electronically Signed   By: Dwyane Dee M.D.   On: 03/13/2018 17:04   Ct Head Wo Contrast  Result Date: 03/13/2018 CLINICAL DATA:  Fall EXAM: CT HEAD WITHOUT CONTRAST CT CERVICAL SPINE WITHOUT CONTRAST TECHNIQUE: Multidetector CT imaging of the head and cervical spine was performed following the standard protocol without intravenous contrast. Multiplanar CT image reconstructions of the cervical spine were also generated. COMPARISON:  None. FINDINGS: CT HEAD FINDINGS Brain: There is no mass, hemorrhage or extra-axial collection. There is generalized atrophy without lobar predilection. There is hypoattenuation of the periventricular white matter, most commonly indicating chronic ischemic microangiopathy. Vascular: Atherosclerotic calcification of the internal carotid arteries at the skull base. No abnormal hyperdensity of the major intracranial arteries or dural venous sinuses. Skull: The visualized skull base, calvarium and extracranial soft tissues are normal. Sinuses/Orbits: No fluid levels or advanced mucosal thickening of the visualized paranasal sinuses. No mastoid or middle ear effusion. The orbits are normal. CT CERVICAL SPINE FINDINGS Alignment: No static subluxation. Facets are aligned. Occipital condyles are normally positioned. Skull base and vertebrae: No acute fracture. Soft tissues and spinal canal: No prevertebral fluid or swelling. No visible canal hematoma. Disc levels: No advanced spinal canal or neural foraminal stenosis. Upper chest: No pneumothorax, pulmonary nodule or pleural effusion. Other: Normal visualized paraspinal cervical soft tissues. IMPRESSION: 1. No acute abnormality of the head or cervical spine. 2. Chronic small vessel ischemia and cerebral volume loss. Electronically Signed   By: Deatra Robinson M.D.   On: 03/13/2018 17:58   Ct Cervical Spine  Wo Contrast  Result Date: 03/13/2018 CLINICAL DATA:  Fall EXAM: CT HEAD WITHOUT CONTRAST CT CERVICAL SPINE WITHOUT CONTRAST TECHNIQUE: Multidetector CT imaging of the head and cervical spine was performed following the standard protocol without intravenous contrast. Multiplanar CT image reconstructions of the cervical spine were also generated. COMPARISON:  None. FINDINGS: CT HEAD FINDINGS Brain: There is no mass, hemorrhage or extra-axial collection. There is generalized atrophy without lobar predilection. There is hypoattenuation of the periventricular white matter, most commonly indicating chronic ischemic microangiopathy. Vascular: Atherosclerotic calcification of the internal carotid arteries at the skull base. No abnormal hyperdensity of the major intracranial arteries or dural venous sinuses. Skull: The visualized skull base, calvarium and extracranial soft tissues are normal. Sinuses/Orbits: No fluid levels or advanced mucosal thickening of the visualized paranasal sinuses. No mastoid or middle ear effusion. The orbits are normal. CT CERVICAL SPINE FINDINGS Alignment: No static subluxation. Facets are aligned. Occipital condyles are normally positioned. Skull base and vertebrae: No acute fracture. Soft tissues and spinal canal: No prevertebral fluid or swelling. No visible canal hematoma. Disc levels: No advanced spinal canal or neural foraminal stenosis. Upper chest: No pneumothorax, pulmonary nodule or pleural effusion. Other: Normal visualized paraspinal cervical soft tissues. IMPRESSION: 1. No acute abnormality of the head or cervical spine. 2. Chronic small vessel ischemia and cerebral volume loss. Electronically Signed   By: Deatra Robinson M.D.   On: 03/13/2018 17:58   Mr Brain Wo Contrast  Result Date: 03/14/2018 CLINICAL DATA:  Initial evaluation for acute altered mental status. The EXAM: MRI HEAD WITHOUT CONTRAST TECHNIQUE: Multiplanar, multiecho pulse sequences of the brain and surrounding  structures were obtained without intravenous contrast. COMPARISON:  Prior CT from 03/13/2018. FINDINGS: Brain: Examination mildly degraded by motion artifact. Diffuse prominence of the CSF containing spaces compatible with generalized cerebral atrophy, mildly advanced in nature. Confluent T2/FLAIR hyperintensity within the periventricular white matter most consistent with chronic small vessel ischemic disease, mild for age. There is minimal patchy diffusion abnormality involving the cortical and subcortical white matter within the right frontal lobe (series 5, image 81), consistent with small acute right MCA territory infarcts. A few additional punctate foci of diffusion abnormality within the bilateral cerebellar hemispheres (series 5, image 54, 53, 46), also consistent with acute/early subacute ischemic infarcts. Findings are likely embolic in nature. No associated hemorrhage or mass effect. No other evidence for acute or subacute ischemia. Gray-white matter differentiation otherwise maintained. No acute or chronic intracranial hemorrhage. No mass lesion, midline shift or mass effect. Diffuse ventricular prominence most like related global parenchymal volume loss. A degree of underlying NPH would be difficult to exclude. No extra-axial fluid collection. Pituitary gland within normal limits. Vascular: Major intracranial vascular flow voids maintained. Skull and upper cervical spine: Craniocervical junction normal. Upper cervical spine within normal limits. Bone marrow signal intensity normal. No scalp soft tissue abnormality. Sinuses/Orbits: Patient status post ocular lens replacement bilaterally. Paranasal sinuses are clear. Trace opacity left mastoid air cells, of doubtful significance. Other: End. IMPRESSION: 1. Few scattered patchy small volume acute to early subacute ischemic infarcts involving the cortical subcortical right frontal lobe as well as the bilateral cerebellar hemispheres as above. Findings likely  embolic in nature. No associated hemorrhage. 2. No other acute intracranial abnormality. 3. Mildly advanced cerebral atrophy with chronic small vessel ischemic disease. 4. Diffuse ventriculomegaly. While this finding may be related to underlying global atrophy, a component of NPH may be contributory, and could be considered in the correct clinical setting. Electronically Signed   By: Rise Mu M.D.   On: 03/14/2018 05:57    Valentina Lucks, MSN, NP-C [facility] [phone]   03/14/2018, 8:52 AM   Attending physician note to follow with Assessment and plan .   I have seen the patient and reviewed the above note.  Assessment: 83 y.o. female  with PMH significant for dementia, HTN, Arthritis who presented to [facility] 03/13/18  From [facility] with a change in mental status over the past 2 days. MRI with multifocal strokes that are likely embolic in nature. She will need embolic workup.   Impression: Stroke Stroke Risk Factors - hypertension    Recommendations: -- BP goal : Permissive HTN upto 220/110 mmHg  --Echocardiogram -- ASA -- High intensity Statin if LDL > 70  -- HgbA1c, fasting lipid panel -- PT consult, OT consult, Speech consult --Telemetry monitoring --Frequent neuro checks --Stroke swallow screen --please page stroke NP  Or  PA  Or MD from 8am -4 pm  as this patient from this time will be  followed by the stroke.   You can look them up on [email or web address]  Password [email or web address]  Ritta Slot, MD [facility] [phone]  If 7pm- 7am, please page neurology on call as listed in AMION.

## 2018-03-14 NOTE — Progress Notes (Signed)
Patient was transported in a low bed to 3w36. Daughter at bedside. Monitor removed and CCMD called. Neuro practitioner and nurse at bedside.

## 2018-03-14 NOTE — Progress Notes (Signed)
Attempted report to 3W36. Nurse is busy at the moment.

## 2018-03-14 NOTE — Progress Notes (Signed)
CRITICAL VALUE ALERT  Critical Value:  Troponin 0.07  Date & Time Notied:  03/14/2018 0145  Provider Notified: Dr Allena Katz  Orders Received/Actions taken: Repeat Troponin at 0500 am

## 2018-03-14 NOTE — ED Notes (Signed)
Attempted report x1. 

## 2018-03-14 NOTE — Clinical Social Work Note (Signed)
Clinical Social Work Assessment  Patient Details  Name: Nina Bell MRN: [id number] Date of Birth: 1934/07/21  Date of referral:  03/14/18               Reason for consult:  Facility Placement                Permission sought to share information with:  Facility Sport and exercise psychologist, Family Supports Permission granted to share information::  Yes, Verbal Permission Granted  Name::     Amy  Agency::  [facility]  Relationship::  Daughter  Contact Information:     Housing/Transportation Living arrangements for the past 2 months:  [place] of Information:  Patient, Medical Team Patient Interpreter Needed:  None Criminal Activity/Legal Involvement Pertinent to Current Situation/Hospitalization:  No - Comment as needed Significant Relationships:  Adult Children Lives with:  Self, Facility Resident Do you feel safe going back to the place where you live?  Yes Need for family participation in patient care:  Yes (Comment)  Care giving concerns:  Patient from [facility] and reports no concerns about care received.   Social Worker assessment / plan:  CSW met with patient to confirm that she came from [facility] and planned to return at discharge. Patient said that as far as she knew, the plan was for her to return there. CSW said she'd help make sure the patient's return went smoothly, and patient was appreciative.  Employment status:  Retired Nurse, adult PT Recommendations:  [place], Home with Center / Referral to community resources:     Patient/Family's Response to care:  Patient agreeable to return to ALF.  Patient/Family's Understanding of and Emotional Response to Diagnosis, Current Treatment, and Prognosis:  Patient aware of where she came from and that she believed she'd be going back. Patient said they'd have to wait and see before she knew when she'd be going back  home.  Emotional Assessment Appearance:  Appears stated age Attitude/Demeanor/Rapport:  Engaged Affect (typically observed):  Pleasant Orientation:  Oriented to Self, Oriented to Place, Oriented to  Time Alcohol / Substance use:  Not Applicable Psych involvement (Current and /or in the community):  No (Comment)  Discharge Needs  Concerns to be addressed:  Care Coordination Readmission within the last 30 days:  No Current discharge risk:  Physical Impairment, Cognitively Impaired Barriers to Discharge:  Continued Medical Work up   [employer], [place] 03/14/2018, 4:35 PM

## 2018-03-14 NOTE — Progress Notes (Addendum)
Spoke with Herbert Seta charge nurse  to give report on 3w36. Nurse currently in a patients room.

## 2018-03-14 NOTE — Progress Notes (Signed)
MD attending rounding.

## 2018-03-14 NOTE — Progress Notes (Signed)
Reviewed patients MRI results. Paging admissions for proper assigned MD.

## 2018-03-14 NOTE — Progress Notes (Signed)
  Echocardiogram 2D Echocardiogram has been performed.  Eilene Voigt L Androw 03/14/2018, 10:28 AM

## 2018-03-14 NOTE — Evaluation (Signed)
Occupational Therapy Evaluation Patient Details Name: Nina Bell MRN: [id number] DOB: Mar 22, 1935 Today's Date: 03/14/2018    History of Present Illness 83 y.o. female with medical history significant for Dementia, HTN, and arthritis who presents to the ED with change in mental status over the last 2 days.Patient reportedly had a witnessed fall in facility the day prior to admission without injury.  Since then the daughter and facility staff noticed that she had a notable change in her mental status and functional status.    Clinical Impression   Pt admitted with the above diagnoses and presents with below problem list. Pt will benefit from continued acute OT to address the below listed deficits and maximize independence with basic ADLs prior to d/c back to [facility] with continued therapy in that setting. PTA pt was needing assist with bathing and would stand-pivot to/from her w/c with a rw per daughter's report. Pt is currently mod-max A with UB/LB ADLs and stand-pivot transfers. Presents with generalized weakness and decreased cognition from baseline. Daughter present throughout session.      Follow Up Recommendations  Home health OT;Supervision/Assistance - 24 hour    Equipment Recommendations  None recommended by OT    Recommendations for Other Services PT consult;Speech consult     Precautions / Restrictions Precautions Precautions: Fall Restrictions Other Position/Activity Restrictions: Stage II pressure ulcer left buttock      Mobility Bed Mobility Overal bed mobility: Needs Assistance Bed Mobility: Supine to Sit     Supine to sit: Min assist;HOB elevated     General bed mobility comments: Cues needed for sequencing. Assist to powerup trunk and pivot hips to full EOB position.  Transfers Overall transfer level: Needs assistance Equipment used: Rolling walker (2 wheeled) Transfers: Sit to/from [employer] Sit to Stand: Max assist Stand  pivot transfers: Max assist;Mod assist       General transfer comment: EOB with height ultimately elevated>recliner. Assist to powerup and control decent. Pt reporting difficulty advancing LLE at midpoint. Extra time and effort. Cues for sequencing with rw.    Balance Overall balance assessment: Needs assistance;History of Falls Sitting-balance support: Bilateral upper extremity supported;Feet supported Sitting balance-Leahy Scale: Fair     Standing balance support: Bilateral upper extremity supported;During functional activity Standing balance-Leahy Scale: Poor                             ADL either performed or assessed with clinical judgement   ADL Overall ADL's : Needs assistance/impaired Eating/Feeding: NPO   Grooming: Maximal assistance;Sitting   Upper Body Bathing: Maximal assistance;Sitting   Lower Body Bathing: Maximal assistance;Sit to/from stand   Upper Body Dressing : Maximal assistance;Sitting   Lower Body Dressing: Maximal assistance;Sit to/from stand   Toilet Transfer: Maximal assistance;Stand-pivot;Moderate assistance   Toileting- Clothing Manipulation and Hygiene: Maximal assistance;Sit to/from stand   Tub/ Shower Transfer: Maximal assistance;Moderate assistance;Stand-pivot;3 in 1;Rolling walker Tub/Shower Transfer Details (indicate cue type and reason): simulated with EOB>recliner   General ADL Comments: Pt completed bed mobility and SPT to recliner     Vision Baseline Vision/History: Wears glasses Wears Glasses: At all times       Perception     Praxis      Pertinent Vitals/Pain Pain Assessment: No/denies pain     Hand Dominance     Extremity/Trunk Assessment Upper Extremity Assessment Upper Extremity Assessment: Generalized weakness;Difficult to assess due to impaired cognition   Lower Extremity Assessment Lower Extremity Assessment: Defer  to PT evaluation   Cervical / Trunk Assessment Cervical / Trunk Assessment:  Kyphotic   Communication Communication Communication: Other (comment)(limited spontaneous verbalizations, responds to basic questi)   Cognition Arousal/Alertness: Awake/alert Behavior During Therapy: Flat affect Overall Cognitive Status: Impaired/Different from baseline Area of Impairment: Awareness;Safety/judgement;Following commands;Memory;Attention;Orientation;Problem solving                   Current Attention Level: Focused Memory: Decreased recall of precautions;Decreased short-term memory Following Commands: Follows one step commands consistently;Follows one step commands with increased time;Follows multi-step commands inconsistently   Awareness: Intellectual Problem Solving: Slow processing;Decreased initiation;Difficulty sequencing;Requires verbal cues General Comments: Cognitive deficits at baseline but per daughter's report has worsened suddenly in the past few days   General Comments       Exercises     Shoulder Instructions      Home Living Family/patient expects to be discharged to:: Skilled nursing facility                                 Additional Comments: from [facility]. Had been doing PT there PTA      Prior Functioning/Environment Level of Independence: Needs assistance  Gait / Transfers Assistance Needed: Per daughter's report pt stand-pivots to/from w/c with rw. Has not walked more than this in about a year and a half except occasionally during PT session if she is having a good day. ADL's / Homemaking Assistance Needed: assist with bathing "so she doesn't fall", dresses self per daughter's report            OT Problem List: Decreased activity tolerance;Impaired balance (sitting and/or standing);Decreased strength;Decreased cognition;Decreased safety awareness;Decreased knowledge of use of DME or AE;Decreased knowledge of precautions      OT Treatment/Interventions: Self-care/ADL training;DME and/or AE  instruction;Cognitive remediation/compensation;Therapeutic activities;Patient/family education;Balance training    OT Goals(Current goals can be found in the care plan section) Acute Rehab OT Goals Patient Stated Goal: daughter: to return to [facility], pt seems agreeable to this.  OT Goal Formulation: With patient/family Time For Goal Achievement: 03/28/18 Potential to Achieve Goals: Good ADL Goals Pt Will Perform Grooming: with supervision;with set-up;sitting Pt Will Perform Upper Body Bathing: with set-up;sitting Pt Will Perform Lower Body Bathing: with min guard assist;sit to/from stand Pt Will Perform Upper Body Dressing: with set-up;sitting Pt Will Perform Lower Body Dressing: with min guard assist;sit to/from stand Pt Will Transfer to Toilet: with min assist;stand pivot transfer;bedside commode Additional ADL Goal #1: Pt will complete bed mobility at supervision level to prepare for OOB ADLs.  OT Frequency: Min 2X/week   Barriers to D/C:            Co-evaluation              AM-PAC PT "6 Clicks" Daily Activity     Outcome Measure Help from another person eating meals?: A Lot(clinical judgement, Pt is NPO at this time) Help from another person taking care of personal grooming?: A Lot Help from another person toileting, which includes using toliet, bedpan, or urinal?: A Lot Help from another person bathing (including washing, rinsing, drying)?: A Lot Help from another person to put on and taking off regular upper body clothing?: A Lot Help from another person to put on and taking off regular lower body clothing?: A Lot 6 Click Score: 12   End of Session Equipment Utilized During Treatment: Gait belt;Rolling walker  Activity Tolerance: Patient tolerated treatment well  Patient left: in chair;with call bell/phone within reach;with chair alarm set;with family/visitor present  OT Visit Diagnosis: Unsteadiness on feet (R26.81);Muscle weakness (generalized)  (M62.81);Other abnormalities of gait and mobility (R26.89);History of falling (Z91.81);Other symptoms and signs involving cognitive function                Time: 4540-9811 OT Time Calculation (min): 29 min Charges:  OT General Charges $OT Visit: 1 Visit OT Evaluation $OT Eval Low Complexity: 1 Low OT Treatments $Self Care/Home Management : 8-22 mins  Raynald Kemp, OT Acute Rehabilitation Services Pager: [phone] Office: [phone]   Pilar Grammes 03/14/2018, 1:20 PM

## 2018-03-14 NOTE — Progress Notes (Signed)
Patient arrived to unit 3E10 with her daughter. Patient oriented to person.

## 2018-03-14 NOTE — Progress Notes (Signed)
Progress Note    Nina Bell  WUJ:[id number] DOB: [birth year]  DOA: 03/13/2018 PCP: Florentina Jenny, MD    Brief Narrative:     Medical records reviewed and are as summarized below:  Nina Bell is an 83 y.o. female with medical history significant for Dementia, HTN, and arthritis who presents to the ED with change in mental status over the last 2 days.  Daughter is at bedside who provides majority of history as patient is only able to answer occasional yes/no questions and was gives incorrect information per daughter.  Patient reportedly had a witnessed fall in facility the day prior to admission without injury.  Since then the daughter and facility staff noticed that she had a notable change in her mental status and functional status.  Daughter notices that patient has been leaning more towards the right and has been very somnolent over the last day.  She has also noticed a change in facial movement as if the patient is constantly "puckering" her lips.  She has had occasional forgetfulness.  Normally patient is completely alert and oriented.  She is previously able to ambulate with a walker, now transports with a wheelchair and that she seems like she has difficulty understanding safe use of the walker.   Assessment/Plan:   Principal Problem:   Altered mental status Active Problems:   Hypertension   Dementia (HCC)   Pressure injury of skin  CVA -MRI with multiple subacute emboli-- ? Embolic -monitor on tele -echo/carotid -FLP/HGbA1c -swallow eval -ASA -neurology consult appreciated -PT/OT   HTN -permissive HTN  Dementia -from [facility] -Social work consult  Pressure Injury 03/14/18 Stage II -  Partial thickness loss of dermis presenting as a shallow open ulcer with a red, pink wound bed without slough. (Active)  03/14/18 0200   Location: Buttocks  Location Orientation: Right  Staging: Stage II -  Partial thickness loss of dermis presenting as a  shallow open ulcer with a red, pink wound bed without slough.  Wound Description (Comments):   Present on Admission:      Family Communication/Anticipated D/C date and plan/Code Status   DVT prophylaxis: Lovenox ordered. Code Status: dnr Family Communication: at bedside Disposition Plan:    Medical Consultants:    Neuro     Subjective:   No pain Feels "ok"  Objective:    Vitals:   03/14/18 0501 03/14/18 0659 03/14/18 0901 03/14/18 0939  BP: (!) 172/50 (!) 158/48 (!) 160/51 (!) 158/44  Pulse: 68 66 66 67  Resp: 18  20   Temp: 98.2 F (36.8 C)  97.8 F (36.6 C) 97.9 F (36.6 C)  TempSrc: Oral  Oral Oral  SpO2: 95%  97% 95%  Weight:      Height:        Intake/Output Summary (Last 24 hours) at 03/14/2018 1002 Last data filed at 03/14/2018 0500 Gross per 24 hour  Intake 999.99 ml  Output 500 ml  Net 499.99 ml   Filed Weights   03/13/18 1637 03/14/18 0100 03/14/18 0458  Weight: 65.8 kg 59 kg 59 kg    Exam: In bed, pleasant and cooperative rrr No increased work of breathing No wheezing No LE edema +BS, soft, NT Moves all 4 ext  Data Reviewed:   I have personally reviewed following labs and imaging studies:  Labs: Labs show the following:   Basic Metabolic Panel: Recent Labs  Lab 03/13/18 1806 03/14/18 0531  NA 138 139  K 4.4 3.7  CL 106  110  CO2 23 24  GLUCOSE 109* 96  BUN 19 12  CREATININE 0.98 0.76  CALCIUM 9.1 8.3*   GFR Estimated Creatinine Clearance: 50.5 mL/min (by C-G formula based on SCr of 0.76 mg/dL). Liver Function Tests: Recent Labs  Lab 03/13/18 1806  AST 19  ALT 18  ALKPHOS 65  BILITOT 0.5  PROT 7.0  ALBUMIN 3.5   No results for input(s): LIPASE, AMYLASE in the last 168 hours. No results for input(s): AMMONIA in the last 168 hours. Coagulation profile Recent Labs  Lab 03/13/18 1806  INR 1.01    CBC: Recent Labs  Lab 03/13/18 1806 03/14/18 0531  WBC 11.4* 9.1  NEUTROABS 7.5  --   HGB 12.8 11.4*    HCT 38.9 34.3*  MCV 96.3 94.0  PLT 312 223   Cardiac Enzymes: Recent Labs  Lab 03/13/18 2350 03/14/18 0531  TROPONINI 0.07* 0.06*   BNP (last 3 results) No results for input(s): PROBNP in the last 8760 hours. CBG: Recent Labs  Lab 03/13/18 1804  GLUCAP 99   D-Dimer: No results for input(s): DDIMER in the last 72 hours. Hgb A1c: No results for input(s): HGBA1C in the last 72 hours. Lipid Profile: No results for input(s): CHOL, HDL, LDLCALC, TRIG, CHOLHDL, LDLDIRECT in the last 72 hours. Thyroid function studies: Recent Labs    03/13/18 2350  TSH 1.064   Anemia work up: No results for input(s): VITAMINB12, FOLATE, FERRITIN, TIBC, IRON, RETICCTPCT in the last 72 hours. Sepsis Labs: Recent Labs  Lab 03/13/18 1806 03/14/18 0531  WBC 11.4* 9.1    Microbiology Recent Results (from the past 240 hour(s))  MRSA PCR Screening     Status: None   Collection Time: 03/14/18  2:08 AM  Result Value Ref Range Status   MRSA by PCR NEGATIVE NEGATIVE Final    Comment:        The GeneXpert MRSA Assay (FDA approved for NASAL specimens only), is one component of a comprehensive MRSA colonization surveillance program. It is not intended to diagnose MRSA infection nor to guide or monitor treatment for MRSA infections. Performed at [facility], [address] [place], [place] [address]     Procedures and diagnostic studies:  Dg Chest 1 View  Result Date: 03/13/2018 CLINICAL DATA:  Altered mental status, fell yesterday, disorientation EXAM: CHEST  1 VIEW COMPARISON:  None. FINDINGS: No active infiltrate or effusion is seen. Mediastinal and hilar contours are unremarkable. The heart is mildly enlarged. No bony abnormality is seen. A small hiatal hernia cannot be excluded. IMPRESSION: 1. No active lung disease. 2. Borderline cardiomegaly. Electronically Signed   By: Dwyane Dee M.D.   On: 03/13/2018 17:04   Ct Head Wo Contrast  Result Date: 03/13/2018 CLINICAL DATA:   Fall EXAM: CT HEAD WITHOUT CONTRAST CT CERVICAL SPINE WITHOUT CONTRAST TECHNIQUE: Multidetector CT imaging of the head and cervical spine was performed following the standard protocol without intravenous contrast. Multiplanar CT image reconstructions of the cervical spine were also generated. COMPARISON:  None. FINDINGS: CT HEAD FINDINGS Brain: There is no mass, hemorrhage or extra-axial collection. There is generalized atrophy without lobar predilection. There is hypoattenuation of the periventricular white matter, most commonly indicating chronic ischemic microangiopathy. Vascular: Atherosclerotic calcification of the internal carotid arteries at the skull base. No abnormal hyperdensity of the major intracranial arteries or dural venous sinuses. Skull: The visualized skull base, calvarium and extracranial soft tissues are normal. Sinuses/Orbits: No fluid levels or advanced mucosal thickening of the  visualized paranasal sinuses. No mastoid or middle ear effusion. The orbits are normal. CT CERVICAL SPINE FINDINGS Alignment: No static subluxation. Facets are aligned. Occipital condyles are normally positioned. Skull base and vertebrae: No acute fracture. Soft tissues and spinal canal: No prevertebral fluid or swelling. No visible canal hematoma. Disc levels: No advanced spinal canal or neural foraminal stenosis. Upper chest: No pneumothorax, pulmonary nodule or pleural effusion. Other: Normal visualized paraspinal cervical soft tissues. IMPRESSION: 1. No acute abnormality of the head or cervical spine. 2. Chronic small vessel ischemia and cerebral volume loss. Electronically Signed   By: Deatra Robinson M.D.   On: 03/13/2018 17:58   Ct Cervical Spine Wo Contrast  Result Date: 03/13/2018 CLINICAL DATA:  Fall EXAM: CT HEAD WITHOUT CONTRAST CT CERVICAL SPINE WITHOUT CONTRAST TECHNIQUE: Multidetector CT imaging of the head and cervical spine was performed following the standard protocol without intravenous contrast.  Multiplanar CT image reconstructions of the cervical spine were also generated. COMPARISON:  None. FINDINGS: CT HEAD FINDINGS Brain: There is no mass, hemorrhage or extra-axial collection. There is generalized atrophy without lobar predilection. There is hypoattenuation of the periventricular white matter, most commonly indicating chronic ischemic microangiopathy. Vascular: Atherosclerotic calcification of the internal carotid arteries at the skull base. No abnormal hyperdensity of the major intracranial arteries or dural venous sinuses. Skull: The visualized skull base, calvarium and extracranial soft tissues are normal. Sinuses/Orbits: No fluid levels or advanced mucosal thickening of the visualized paranasal sinuses. No mastoid or middle ear effusion. The orbits are normal. CT CERVICAL SPINE FINDINGS Alignment: No static subluxation. Facets are aligned. Occipital condyles are normally positioned. Skull base and vertebrae: No acute fracture. Soft tissues and spinal canal: No prevertebral fluid or swelling. No visible canal hematoma. Disc levels: No advanced spinal canal or neural foraminal stenosis. Upper chest: No pneumothorax, pulmonary nodule or pleural effusion. Other: Normal visualized paraspinal cervical soft tissues. IMPRESSION: 1. No acute abnormality of the head or cervical spine. 2. Chronic small vessel ischemia and cerebral volume loss. Electronically Signed   By: Deatra Robinson M.D.   On: 03/13/2018 17:58   Mr Brain Wo Contrast  Result Date: 03/14/2018 CLINICAL DATA:  Initial evaluation for acute altered mental status. The EXAM: MRI HEAD WITHOUT CONTRAST TECHNIQUE: Multiplanar, multiecho pulse sequences of the brain and surrounding structures were obtained without intravenous contrast. COMPARISON:  Prior CT from 03/13/2018. FINDINGS: Brain: Examination mildly degraded by motion artifact. Diffuse prominence of the CSF containing spaces compatible with generalized cerebral atrophy, mildly advanced in  nature. Confluent T2/FLAIR hyperintensity within the periventricular white matter most consistent with chronic small vessel ischemic disease, mild for age. There is minimal patchy diffusion abnormality involving the cortical and subcortical white matter within the right frontal lobe (series 5, image 81), consistent with small acute right MCA territory infarcts. A few additional punctate foci of diffusion abnormality within the bilateral cerebellar hemispheres (series 5, image 54, 53, 46), also consistent with acute/early subacute ischemic infarcts. Findings are likely embolic in nature. No associated hemorrhage or mass effect. No other evidence for acute or subacute ischemia. Gray-white matter differentiation otherwise maintained. No acute or chronic intracranial hemorrhage. No mass lesion, midline shift or mass effect. Diffuse ventricular prominence most like related global parenchymal volume loss. A degree of underlying NPH would be difficult to exclude. No extra-axial fluid collection. Pituitary gland within normal limits. Vascular: Major intracranial vascular flow voids maintained. Skull and upper cervical spine: Craniocervical junction normal. Upper cervical spine within normal limits. Bone marrow signal intensity  normal. No scalp soft tissue abnormality. Sinuses/Orbits: Patient status post ocular lens replacement bilaterally. Paranasal sinuses are clear. Trace opacity left mastoid air cells, of doubtful significance. Other: End. IMPRESSION: 1. Few scattered patchy small volume acute to early subacute ischemic infarcts involving the cortical subcortical right frontal lobe as well as the bilateral cerebellar hemispheres as above. Findings likely embolic in nature. No associated hemorrhage. 2. No other acute intracranial abnormality. 3. Mildly advanced cerebral atrophy with chronic small vessel ischemic disease. 4. Diffuse ventriculomegaly. While this finding may be related to underlying global atrophy, a  component of NPH may be contributory, and could be considered in the correct clinical setting. Electronically Signed   By: Rise Mu M.D.   On: 03/14/2018 05:57    Medications:   .  stroke: mapping our early stages of recovery book   Does not apply Once  . aspirin EC  81 mg Oral Daily  . enoxaparin (LOVENOX) injection  40 mg Subcutaneous Q24H  . sodium chloride flush  3 mL Intravenous Q12H   Continuous Infusions:   LOS: 0 days   Joseph Art  [facility]   *Please refer to [email or web address], password [email or web address] to get updated schedule on who will round on this patient, as hospitalists switch teams weekly. If 7PM-7AM, please contact night-coverage at [email or web address], password [email or web address] for any overnight needs.  03/14/2018, 10:02 AM

## 2018-03-14 NOTE — Care Management Note (Signed)
Case Management Note  Patient Details  Name: Nina Bell MRN: [id number] Date of Birth: [birth year]  Subjective/Objective:   Pt admitted with CVA. She is from [facility].                  Action/Plan: OT recommending HH services. Awaiting PT eval. CM following for d/c needs. Plan is for her to return to [facility].  Expected Discharge Date:                  Expected Discharge Plan:  Assisted Living / Rest Home  In-House Referral:  Clinical Social Work  Discharge planning Services     Post Acute Care Choice:    Choice offered to:     DME Arranged:    DME Agency:     HH Arranged:    HH Agency:     Status of Service:  In process, will continue to follow  If discussed at Long Length of Stay Meetings, dates discussed:    Additional Comments:  Kermit Balo, RN 03/14/2018, 3:11 PM

## 2018-03-14 NOTE — Evaluation (Signed)
Physical Therapy Evaluation Patient Details Name: Nina Bell MRN: [id number] DOB: [birth year] Today's Date: 03/14/2018   History of Present Illness  83 y.o. female with medical history significant for Dementia, HTN, and arthritis who presents to the ED with change in mental status over the last 2 days.Patient reportedly had a witnessed fall in facility the day prior to admission without injury.  Since then the daughter and facility staff noticed that she had a notable change in her mental status and functional status.   Clinical Impression  Pt admitted with above diagnosis. Pt currently with functional limitations due to the deficits listed below (see PT Problem List). Pt was only able to stand with mod to max assist of 2 and very fearful of falling.  Was at SNF and transfers only PTA.  Will try to assist pt making transfers easier.  Will follow acutely.   Pt will benefit from skilled PT to increase their independence and safety with mobility to allow discharge to the venue listed below.      Follow Up Recommendations SNF;Supervision/Assistance - 24 hour    Equipment Recommendations  None recommended by PT    Recommendations for Other Services       Precautions / Restrictions Precautions Precautions: Fall Restrictions Weight Bearing Restrictions: No Other Position/Activity Restrictions: Stage II pressure ulcer left buttock      Mobility  Bed Mobility Overal bed mobility: Needs Assistance Bed Mobility: Supine to Sit     Supine to sit: Min assist;HOB elevated     General bed mobility comments: in chair on arrival  Transfers Overall transfer level: Needs assistance Equipment used: Rolling walker (2 wheeled) Transfers: Sit to/from [employer] Sit to Stand: Max assist Stand pivot transfers: Max assist;Mod assist       General transfer comment:  Assist to powerup and control descent. Pt reporting difficulty advancing LLE at midpoint. Extra time and effort.  Cues for sequencing with rw.  Pt would not stand completely up with RW in front of her.  Pt did stand up partially when PT stood in front of pt and just stood still needing mod assist and cues.   Ambulation/Gait             General Gait Details: unable - hasnt walked in over a year  Stairs            Wheelchair Mobility    Modified Rankin (Stroke Patients Only)       Balance Overall balance assessment: Needs assistance;History of Falls Sitting-balance support: Bilateral upper extremity supported;Feet supported Sitting balance-Leahy Scale: Fair     Standing balance support: Bilateral upper extremity supported;During functional activity Standing balance-Leahy Scale: Poor Standing balance comment: relies on bil UEs upport in standing.                              Pertinent Vitals/Pain Pain Assessment: No/denies pain    Home Living Family/patient expects to be discharged to:: Skilled nursing facility                 Additional Comments: from [facility]. Had been doing PT there PTA    Prior Function Level of Independence: Needs assistance   Gait / Transfers Assistance Needed: Per daughter's report pt stand-pivots to/from w/c with rw. Has not walked more than this in about a year and a half except occasionally during PT session if she is having a good day.  ADL's / [employer]  Needed: assist with bathing "so she doesn't fall", dresses self per daughter's report        Hand Dominance        Extremity/Trunk Assessment   Upper Extremity Assessment Upper Extremity Assessment: Defer to OT evaluation    Lower Extremity Assessment Lower Extremity Assessment: Generalized weakness    Cervical / Trunk Assessment Cervical / Trunk Assessment: Kyphotic  Communication   Communication: Other (comment)(limited spontaneous verbalizations, responds to basic questi)  Cognition Arousal/Alertness: Awake/alert Behavior During Therapy:  Flat affect Overall Cognitive Status: Impaired/Different from baseline Area of Impairment: Awareness;Safety/judgement;Following commands;Memory;Attention;Orientation;Problem solving                   Current Attention Level: Focused Memory: Decreased recall of precautions;Decreased short-term memory Following Commands: Follows one step commands consistently;Follows one step commands with increased time;Follows multi-step commands inconsistently   Awareness: Intellectual Problem Solving: Slow processing;Decreased initiation;Difficulty sequencing;Requires verbal cues General Comments: Cognitive deficits at baseline but per daughter's report has worsened suddenly in the past few days      General Comments      Exercises     Assessment/Plan    PT Assessment Patient needs continued PT services  PT Problem List Decreased balance;Decreased activity tolerance;Decreased mobility;Decreased knowledge of use of DME;Decreased safety awareness;Decreased knowledge of precautions       PT Treatment Interventions DME instruction;Functional mobility training;Therapeutic activities;Therapeutic exercise;Balance training;Patient/family education;Wheelchair mobility training    PT Goals (Current goals can be found in the Care Plan section)  Acute Rehab PT Goals Patient Stated Goal: daughter: to return to [facility], pt seems agreeable to this.  PT Goal Formulation: Patient unable to participate in goal setting Time For Goal Achievement: 03/28/18 Potential to Achieve Goals: Good    Frequency Min 2X/week   Barriers to discharge Decreased caregiver support      Co-evaluation               AM-PAC PT "6 Clicks" Daily Activity  Outcome Measure Difficulty turning over in bed (including adjusting bedclothes, sheets and blankets)?: Unable Difficulty moving from lying on back to sitting on the side of the bed? : Unable Difficulty sitting down on and standing up from a chair with  arms (e.g., wheelchair, bedside commode, etc,.)?: Unable Help needed moving to and from a bed to chair (including a wheelchair)?: Total Help needed walking in hospital room?: Total Help needed climbing 3-5 steps with a railing? : Total 6 Click Score: 6    End of Session Equipment Utilized During Treatment: Gait belt Activity Tolerance: Patient limited by fatigue Patient left: in chair;with call bell/phone within reach;with chair alarm set Nurse Communication: Mobility status PT Visit Diagnosis: Unsteadiness on feet (R26.81);Muscle weakness (generalized) (M62.81)    Time: 6045-4098 PT Time Calculation (min) (ACUTE ONLY): 18 min   Charges:   PT Evaluation $PT Eval Moderate Complexity: 1 Mod          Diksha Tagliaferro,PT Acute Rehabilitation Services Pager:  [phone]  Office:  [phone]    Berline Lopes 03/14/2018, 4:48 PM

## 2018-03-14 NOTE — Progress Notes (Signed)
Carotid artery duplex has been completed. 1-39% ICA stenosis bilaterally.  03/14/18 10:41 AM Olen Cordial RVT

## 2018-03-14 NOTE — Progress Notes (Signed)
OT Cancellation Note  Patient Details Name: [name] MRN: [id number] DOB: 12/17/1934   Cancelled Treatment:    Reason Eval/Treat Not Completed: Other (comment). Noted OT order has been discontinued. Will sign off. Please reorder if OT eval becomes appropriate.   Raynald Kemp, OT Acute Rehabilitation Services Pager: [phone] Office: [phone]   03/14/2018, 9:39 AM

## 2018-03-15 DIAGNOSIS — I6340 Cerebral infarction due to embolism of unspecified cerebral artery: Principal | ICD-10-CM

## 2018-03-15 DIAGNOSIS — L8990 Pressure ulcer of unspecified site, unspecified stage: Secondary | ICD-10-CM

## 2018-03-15 DIAGNOSIS — I10 Essential (primary) hypertension: Secondary | ICD-10-CM

## 2018-03-15 LAB — LIPID PANEL
CHOL/HDL RATIO: 5.1 ratio
CHOLESTEROL: 184 mg/dL (ref 0–200)
HDL: 36 mg/dL — ABNORMAL LOW (ref >40)
LDL Cholesterol: 129 mg/dL — ABNORMAL HIGH (ref 0–99)
Triglycerides: 94 mg/dL (ref <150)
VLDL: 19 mg/dL (ref 0–40)

## 2018-03-15 LAB — HEMOGLOBIN A1C
HEMOGLOBIN A1C: 5 % (ref 4.8–5.6)
MEAN PLASMA GLUCOSE: 96.8 mg/dL

## 2018-03-15 MED ORDER — ATORVASTATIN CALCIUM 40 MG PO TABS
40.0000 mg | ORAL_TABLET | Freq: Every day | ORAL | Status: DC
  Administered 2018-03-15 – 2018-03-18 (×4): 40 mg via ORAL
  Filled 2018-03-15 (×4): qty 1

## 2018-03-15 NOTE — Progress Notes (Signed)
STROKE TEAM PROGRESS NOTE   SUBJECTIVE (INTERVAL HISTORY) No family is at the bedside.  Pt lying in bed, finished dinner. She is not fully orientated but no significant weakness. She is agreeable for TEE next week.    OBJECTIVE Vitals:   03/14/18 2350 03/15/18 0406 03/15/18 0752 03/15/18 1301  BP: (!) 156/95 (!) 149/50 (!) 177/48 (!) 166/56  Pulse: 86 68 61 88  Resp: 20 18 16 20   Temp: 98 F (36.7 C) 98.7 F (37.1 C) 97.6 F (36.4 C) 98.8 F (37.1 C)  TempSrc: Oral Oral Oral Oral  SpO2: 97% 96% 98% 95%  Weight:      Height:        CBC:  Recent Labs  Lab 03/13/18 1806 03/14/18 0531  WBC 11.4* 9.1  NEUTROABS 7.5  --   HGB 12.8 11.4*  HCT 38.9 34.3*  MCV 96.3 94.0  PLT 312 223    Basic Metabolic Panel:  Recent Labs  Lab 03/13/18 1806 03/14/18 0531  NA 138 139  K 4.4 3.7  CL 106 110  CO2 23 24  GLUCOSE 109* 96  BUN 19 12  CREATININE 0.98 0.76  CALCIUM 9.1 8.3*    Lipid Panel:     Component Value Date/Time   CHOL 184 03/15/2018 0434   TRIG 94 03/15/2018 0434   HDL 36 (L) 03/15/2018 0434   CHOLHDL 5.1 03/15/2018 0434   VLDL 19 03/15/2018 0434   LDLCALC 129 (H) 03/15/2018 0434   HgbA1c:  Lab Results  Component Value Date   HGBA1C 5.0 03/15/2018   Urine Drug Screen: No results found for: LABOPIA, COCAINSCRNUR, LABBENZ, AMPHETMU, THCU, LABBARB  Alcohol Level No results found for: ETH     IMAGING  Ct Angio Head W Or Wo Contrast 03/15/2018 IMPRESSION:   CT HEAD:  1. No acute intracranial process; no acute infarcts not apparent by CT.  2. Stable examination including disproportionate mesial temporal lobe atrophy associated with neuro degenerative syndromes.   CTA NECK:  1. No hemodynamically significant stenosis ICA's.  2. Moderate stenosis LEFT vertebral artery origin, patent vertebral arteries.  3. RIGHT upper lobe pneumonia.   CTA HEAD:  1. No emergent large vessel occlusion. No cerebral artery flow-limiting stenosis.  2. Moderate stenosis  RIGHT ICA.   Emphysema (ICD10-J43.9).    Aortic Atherosclerosis (ICD10-I70.0).    Ct Head Wo Contrast 03/13/2018 IMPRESSION:  1. No acute abnormality of the head or cervical spine.  2. Chronic small vessel ischemia and cerebral volume loss.    Mr Brain Wo Contrast 03/14/2018 IMPRESSION:  1. Few scattered patchy small volume acute to early subacute ischemic infarcts involving the cortical subcortical right frontal lobe as well as the bilateral cerebellar hemispheres as above. Findings likely embolic in nature. No associated hemorrhage.  2. No other acute intracranial abnormality.  3. Mildly advanced cerebral atrophy with chronic small vessel ischemic disease.  4. Diffuse ventriculomegaly. While this finding may be related to underlying global atrophy, a component of NPH may be contributory, and could be considered in the correct clinical setting.   Vas US Carotid (at [facility]) 03/14/2018 Summary:  Right Carotid: Velocities in the right ICA are consistent with a 1-39% stenosis.  Left Carotid: Velocities in the left ICA are consistent with a 1-39% stenosis.  Vertebrals: Bilateral vertebral arteries demonstrate antegrade flow.    Transthoracic Echocardiogram  03/14/2018 Study Conclusions - Left ventricle: The cavity size was normal. Wall thickness was   normal. Systolic function was normal. The  estimated ejection   fraction was in the range of 60% to 65%. Wall motion was normal;   there were no regional wall motion abnormalities. Doppler   parameters are consistent with abnormal left ventricular   relaxation (grade 1 diastolic dysfunction). - Aortic valve: There was no stenosis. There was trivial   regurgitation. - Mitral valve: Moderately calcified annulus. There appears to be   mobile calcification on the lateral annulus. - Left atrium: The atrium was mildly dilated. - Right ventricle: The cavity size was normal. Systolic function   was normal. - Pulmonary arteries: No  complete TR doppler jet so unable to   estimate PA systolic pressure. - Inferior vena cava: The vessel was normal in size. The   respirophasic diameter changes were in the normal range (>= 50%),   consistent with normal central venous pressure. Impressions: - Normal LV size with EF 60-65%. Normal RV size and systolic   function. There is moderate mitral annular calcification with the   appearance of mobile calcification on the lateral annulus. Given   history of CVA, would evaluate this with TEE.   PHYSICAL EXAM  Temp:  [97.6 F (36.4 C)-98.8 F (37.1 C)] 98.4 F (36.9 C) (11/02 1556) Pulse Rate:  [61-98] 79 (11/02 1556) Resp:  [16-20] 16 (11/02 1556) BP: (149-177)/(48-95) 151/55 (11/02 1556) SpO2:  [93 %-98 %] 93 % (11/02 1556)  General - Well nourished, well developed, in no apparent distress.  Ophthalmologic - fundi not visualized due to noncooperation.  Cardiovascular - Regular rate and rhythm.  Mental Status -  Level of arousal and orientation to place was intact, but not orientated to time or situation. Language including expression, repetition, comprehension was assessed and found intact. Naming 3/4  Fund of knowledge impaired.   Cranial Nerves II - XII - II - Visual field intact OU. III, IV, VI - Extraocular movements intact. V - Facial sensation intact bilaterally. VII - Facial movement intact bilaterally. VIII - Hearing & vestibular intact bilaterally. X - Palate elevates symmetrically. XI - Chin turning & shoulder shrug intact bilaterally. XII - Tongue protrusion intact.  Motor Strength - The patient's strength was normal in all extremities and pronator drift was absent.  Bulk was normal and fasciculations were absent.   Motor Tone - Muscle tone was assessed at the neck and appendages and was normal.  Reflexes - The patient's reflexes were symmetrical in all extremities and she had no pathological reflexes.  Sensory - Light touch, temperature/pinprick were  assessed and were symmetrical.    Coordination - The patient had normal movements in the hands with no ataxia or dysmetria.  Tremor was absent.  Gait and Station - deferred.    ASSESSMENT/PLAN [name] is a 83 y.o. female with history of Htn and dementia presenting with AMS, recent fall, lethargy, and leans to the right. She did not receive IV t-PA due to late presentation.  Strokes: bilateral cerebellum and left frontal small and punctate infarcts - embolic pattern -likely due to mobile MV calcification found on TTE  Resultant - disorientation  CT head  - no acute findings.  MRI head - Few scattered patchy small volume acute to early subacute ischemic infarcts involving the cortical subcortical right frontal lobe as well as the bilateral cerebellar hemispheres  CTA H&N - Moderate stenosis Lt VA origin and Rt intracranial ICA.  Carotid Doppler - unremarkable  2D Echo  - EF 60 - 65%. Mitral annular calcification with possible mobile calcification.   Will  request TEE for further evaluation  LDL - 129  HgbA1c - 5.0  VTE prophylaxis - Lovenox  Diet -  - Heart healthy with thin liquids.  aspirin 81 mg daily prior to admission, now on aspirin 325 mg daily.   Patient counseled to be compliant with her antithrombotic medications  Ongoing aggressive stroke risk factor management  Therapy recommendations:  SNF  Disposition:  Pending  MV mobile calcification  Could be the source of stroke  Will have TEE next week  On ASA for now  Hypertension  Stable . Permissive hypertension (OK if < 220/120) but gradually normalize in 5-7 days . 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 After hours, contact General Neurology

## 2018-03-15 NOTE — Progress Notes (Addendum)
PROGRESS NOTE    Nina Bell  ZOX:[id number] DOB: 09/19/1934 DOA: 03/13/2018 PCP: Florentina Jenny, MD  Brief Narrative: Nina Bell is an 83 y.o. female from ALF with medical history significantfor Dementia, HTN, and arthritiswho presented to the ED with change in mental status over the last 2 days. Daughter noticed that patient has been leaning more towards the right and has been very somnolent over the last day. She has also noticed a change in facial movement as if the patient is constantly "puckering" her lips. -On MRI was noted to have multifocal strokes   Assessment & Plan:   Multifocal strokes -likely embolic in origin -Neurology consulting -No significant deficits at this time -Continue aspirin daily -2D echocardiogram with mitral valve calcification, ? mobile, preserved ejection fraction, may need TEE and loop recorder, await neurology input -Carotid duplex with no significant stenosis -LDL is 129-add statin, HbA1c is 5.0 -PT/OT evaluations completed, SNF/supervision recommended  Mild dementia -Stable, from assisted living facility  Hypertension -Stable, permissive hypertension   Stage II pressure ulcer R buttock -Appreciate wound care input  DVT prophylaxis: Lovenox Code Status: DNR Family Communication: No family at bedside Disposition Plan: Likely back to ALF after work-up completed  Consultants:   Neurology   Procedures:   Antimicrobials:    Subjective: -Feels okay, no complaints  Objective: Vitals:   03/14/18 2016 03/14/18 2350 03/15/18 0406 03/15/18 0752  BP: (!) 173/69 (!) 156/95 (!) 149/50 (!) 177/48  Pulse: 98 86 68 61  Resp: 18 20 18 16   Temp: 98.2 F (36.8 C) 98 F (36.7 C) 98.7 F (37.1 C) 97.6 F (36.4 C)  TempSrc: Oral Oral Oral Oral  SpO2: 95% 97% 96% 98%  Weight:      Height:        Intake/Output Summary (Last 24 hours) at 03/15/2018 1108 Last data filed at 03/15/2018 0555 Gross per 24 hour  Intake 810 ml  Output  1150 ml  Net -340 ml   Filed Weights   03/13/18 1637 03/14/18 0100 03/14/18 0458  Weight: 65.8 kg 59 kg 59 kg    Examination:  General exam: Appears calm and comfortable, distress, sitting up in bed, alert awake oriented x2 Respiratory system: Clear to auscultation. Respiratory effort normal. Cardiovascular system: S1 & S2 heard, RRR.  Gastrointestinal system: Abdomen is nondistended, soft and nontender.Normal bowel sounds heard. Central nervous system: Alert and oriented, moves all extremities, no localizing signs, plantars are withdrawal, DTR 1+. Extremities: No edema Skin: No rashes, lesions or ulcers Psychiatry: Pleasant and appropriate   Data Reviewed:   CBC: Recent Labs  Lab 03/13/18 1806 03/14/18 0531  WBC 11.4* 9.1  NEUTROABS 7.5  --   HGB 12.8 11.4*  HCT 38.9 34.3*  MCV 96.3 94.0  PLT 312 223   Basic Metabolic Panel: Recent Labs  Lab 03/13/18 1806 03/14/18 0531  NA 138 139  K 4.4 3.7  CL 106 110  CO2 23 24  GLUCOSE 109* 96  BUN 19 12  CREATININE 0.98 0.76  CALCIUM 9.1 8.3*   GFR: Estimated Creatinine Clearance: 50.5 mL/min (by C-G formula based on SCr of 0.76 mg/dL). Liver Function Tests: Recent Labs  Lab 03/13/18 1806  AST 19  ALT 18  ALKPHOS 65  BILITOT 0.5  PROT 7.0  ALBUMIN 3.5   No results for input(s): LIPASE, AMYLASE in the last 168 hours. No results for input(s): AMMONIA in the last 168 hours. Coagulation Profile: Recent Labs  Lab 03/13/18 1806  INR 1.01   Cardiac  Enzymes: Recent Labs  Lab 03/13/18 2350 03/14/18 0531  TROPONINI 0.07* 0.06*   BNP (last 3 results) No results for input(s): PROBNP in the last 8760 hours. HbA1C: Recent Labs    03/15/18 0434  HGBA1C 5.0   CBG: Recent Labs  Lab 03/13/18 1804  GLUCAP 99   Lipid Profile: Recent Labs    03/15/18 0434  CHOL 184  HDL 36*  LDLCALC 129*  TRIG 94  CHOLHDL 5.1   Thyroid Function Tests: Recent Labs    03/13/18 2350  TSH 1.064   Anemia Panel: No  results for input(s): VITAMINB12, FOLATE, FERRITIN, TIBC, IRON, RETICCTPCT in the last 72 hours. Urine analysis:    Component Value Date/Time   COLORURINE YELLOW 03/13/2018 2135   APPEARANCEUR CLEAR 03/13/2018 2135   LABSPEC 1.012 03/13/2018 2135   PHURINE 6.0 03/13/2018 2135   GLUCOSEU NEGATIVE 03/13/2018 2135   HGBUR NEGATIVE 03/13/2018 2135   BILIRUBINUR NEGATIVE 03/13/2018 2135   KETONESUR NEGATIVE 03/13/2018 2135   PROTEINUR NEGATIVE 03/13/2018 2135   NITRITE NEGATIVE 03/13/2018 2135   LEUKOCYTESUR NEGATIVE 03/13/2018 2135   Sepsis Labs: @LABRCNTIP (procalcitonin:4,lacticidven:4)  ) Recent Results (from the past 240 hour(s))  MRSA PCR Screening     Status: None   Collection Time: 03/14/18  2:08 AM  Result Value Ref Range Status   MRSA by PCR NEGATIVE NEGATIVE Final    Comment:        The GeneXpert MRSA Assay (FDA approved for NASAL specimens only), is one component of a comprehensive MRSA colonization surveillance program. It is not intended to diagnose MRSA infection nor to guide or monitor treatment for MRSA infections. Performed at [facility], [address] [place], [place] [address]          Radiology Studies: Ct Angio Head W Or Wo Contrast  Result Date: 03/15/2018 CLINICAL DATA:  Stroke follow-up. History of hypertension and dementia. EXAM: CT ANGIOGRAPHY HEAD AND NECK TECHNIQUE: Multidetector CT imaging of the head and neck was performed using the standard protocol during bolus administration of intravenous contrast. Multiplanar CT image reconstructions and MIPs were obtained to evaluate the vascular anatomy. Carotid stenosis measurements (when applicable) are obtained utilizing NASCET criteria, using the distal internal carotid diameter as the denominator. CONTRAST:  50mL ISOVUE-370 IOPAMIDOL (ISOVUE-370) INJECTION 76% COMPARISON:  CT HEAD March 13, 2018 and MRI head March 14, 2018 FINDINGS: CT HEAD FINDINGS BRAIN: No intraparenchymal hemorrhage,  mass effect nor midline shift. Moderate to severe ventriculomegaly with disproportionate mesial temporal lobe atrophy and ex vacuo dilatation temporal horns. Patchy supratentorial white matter hypodensities compatible with moderate chronic small vessel ischemic changes, unchanged. No acute large vascular territory infarcts. No abnormal extra-axial fluid collections. Basal cisterns are patent. VASCULAR: Moderate calcific atherosclerosis of the carotid siphons. SKULL: No skull fracture. No significant scalp soft tissue swelling. SINUSES/ORBITS: Small LEFT maxillary sinus mucosal retention cyst. Mastoid air cells are well aerated.The included ocular globes and orbital contents are non-suspicious. Status post bilateral ocular lens implants. OTHER: None. CTA NECK FINDINGS: AORTIC ARCH: Normal appearance of the thoracic arch, normal branch pattern. Moderate calcific atherosclerosis aortic arch the origins of the innominate, left Common carotid artery and subclavian artery are patent. Mild stenosis LEFT common carotid artery and LEFT subclavian artery origins. RIGHT CAROTID SYSTEM: Common carotid artery is patent. Mild calcific atherosclerosis of the carotid bifurcation without hemodynamically significant stenosis by NASCET criteria. Normal appearance of the internal carotid artery. LEFT CAROTID SYSTEM: Common carotid artery is patent. Mild calcific atherosclerosis of the carotid  bifurcation without hemodynamically significant stenosis by NASCET criteria. Normal appearance of the internal carotid artery. VERTEBRAL ARTERIES:Patent codominant vertebral arteries. Moderate stenosis LEFT vertebral artery origin. SKELETON: No acute osseous process though bone windows have not been submitted. Old manubrial fracture. OTHER NECK: Soft tissues of the neck are nonacute though, not tailored for evaluation. 11 mm RIGHT thyroid nodule, no routine indicated follow-up by consensus. UPPER CHEST: Patchy RIGHT upper lobe subpleural  consolidation. Mild centrilobular emphysema. No superior mediastinal lymphadenopathy. CTA HEAD FINDINGS: ANTERIOR CIRCULATION: Patent cervical internal carotid arteries, petrous, cavernous and supra clinoid internal carotid arteries. Moderate stenosis RIGHT cavernous ICA. 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 Patent anterior communicating artery. Patent anterior and middle cerebral arteries. No large vessel occlusion, significant stenosis, contrast extravasation. POSTERIOR CIRCULATION: Patent vertebral arteries, vertebrobasilar junction and basilar artery, as well as main branch vessels. Patent posterior cerebral arteries, mild luminal irregularity compatible with atherosclerosis. No large vessel occlusion, significant stenosis, contrast extravasation or aneurysm. VENOUS SINUSES: Major dural venous sinuses are patent though not tailored for evaluation on this angiographic examination. ANATOMIC VARIANTS: None. DELAYED PHASE: Moderately motion degraded series, no definite abnormal intracranial enhancement. MIP images reviewed. IMPRESSION: CT HEAD: 1. No acute intracranial process; no acute infarcts not apparent by CT. 2. Stable examination including disproportionate mesial temporal lobe atrophy associated with neuro degenerative syndromes. CTA NECK: 1. No hemodynamically significant stenosis ICA's. 2. Moderate stenosis LEFT vertebral artery origin, patent vertebral arteries. 3. RIGHT upper lobe pneumonia. CTA HEAD: 1. No emergent large vessel occlusion. No cerebral artery flow-limiting stenosis. 2. Moderate stenosis RIGHT ICA. Emphysema (ICD10-J43.9).  Aortic Atherosclerosis (ICD10-I70.0). Electronically Signed   By: Awilda Metro M.D.   On: 03/15/2018 00:15   Dg Chest 1 View  Result Date: 03/13/2018 CLINICAL DATA:  Altered mental status, fell yesterday, disorientation EXAM: CHEST  1 VIEW COMPARISON:  None. FINDINGS: No active infiltrate or effusion is seen. Mediastinal and hilar contours are  unremarkable. The heart is mildly enlarged. No bony abnormality is seen. A small hiatal hernia cannot be excluded. IMPRESSION: 1. No active lung disease. 2. Borderline cardiomegaly. Electronically Signed   By: Dwyane Dee M.D.   On: 03/13/2018 17:04   Ct Head Wo Contrast  Result Date: 03/13/2018 CLINICAL DATA:  Fall EXAM: CT HEAD WITHOUT CONTRAST CT CERVICAL SPINE WITHOUT CONTRAST TECHNIQUE: Multidetector CT imaging of the head and cervical spine was performed following the standard protocol without intravenous contrast. Multiplanar CT image reconstructions of the cervical spine were also generated. COMPARISON:  None. FINDINGS: CT HEAD FINDINGS Brain: There is no mass, hemorrhage or extra-axial collection. There is generalized atrophy without lobar predilection. There is hypoattenuation of the periventricular white matter, most commonly indicating chronic ischemic microangiopathy. Vascular: Atherosclerotic calcification of the internal carotid arteries at the skull base. No abnormal hyperdensity of the major intracranial arteries or dural venous sinuses. Skull: The visualized skull base, calvarium and extracranial soft tissues are normal. Sinuses/Orbits: No fluid levels or advanced mucosal thickening of the visualized paranasal sinuses. No mastoid or middle ear effusion. The orbits are normal. CT CERVICAL SPINE FINDINGS Alignment: No static subluxation. Facets are aligned. Occipital condyles are normally positioned. Skull base and vertebrae: No acute fracture. Soft tissues and spinal canal: No prevertebral fluid or swelling. No visible canal hematoma. Disc levels: No advanced spinal canal or neural foraminal stenosis. Upper chest: No pneumothorax, pulmonary nodule or pleural effusion. Other: Normal visualized paraspinal cervical soft tissues. IMPRESSION: 1. No acute abnormality of the head or cervical spine. 2. Chronic small vessel  ischemia and cerebral volume loss. Electronically Signed   By: Deatra Robinson  M.D.   On: 03/13/2018 17:58   Ct Angio Neck W Or Wo Contrast  Result Date: 03/15/2018 CLINICAL DATA:  Stroke follow-up. History of hypertension and dementia. EXAM: CT ANGIOGRAPHY HEAD AND NECK TECHNIQUE: Multidetector CT imaging of the head and neck was performed using the standard protocol during bolus administration of intravenous contrast. Multiplanar CT image reconstructions and MIPs were obtained to evaluate the vascular anatomy. Carotid stenosis measurements (when applicable) are obtained utilizing NASCET criteria, using the distal internal carotid diameter as the denominator. CONTRAST:  50mL ISOVUE-370 IOPAMIDOL (ISOVUE-370) INJECTION 76% COMPARISON:  CT HEAD March 13, 2018 and MRI head March 14, 2018 FINDINGS: CT HEAD FINDINGS BRAIN: No intraparenchymal hemorrhage, mass effect nor midline shift. Moderate to severe ventriculomegaly with disproportionate mesial temporal lobe atrophy and ex vacuo dilatation temporal horns. Patchy supratentorial white matter hypodensities compatible with moderate chronic small vessel ischemic changes, unchanged. No acute large vascular territory infarcts. No abnormal extra-axial fluid collections. Basal cisterns are patent. VASCULAR: Moderate calcific atherosclerosis of the carotid siphons. SKULL: No skull fracture. No significant scalp soft tissue swelling. SINUSES/ORBITS: Small LEFT maxillary sinus mucosal retention cyst. Mastoid air cells are well aerated.The included ocular globes and orbital contents are non-suspicious. Status post bilateral ocular lens implants. OTHER: None. CTA NECK FINDINGS: AORTIC ARCH: Normal appearance of the thoracic arch, normal branch pattern. Moderate calcific atherosclerosis aortic arch the origins of the innominate, left Common carotid artery and subclavian artery are patent. Mild stenosis LEFT common carotid artery and LEFT subclavian artery origins. RIGHT CAROTID SYSTEM: Common carotid artery is patent. Mild calcific atherosclerosis of  the carotid bifurcation without hemodynamically significant stenosis by NASCET criteria. Normal appearance of the internal carotid artery. LEFT CAROTID SYSTEM: Common carotid artery is patent. Mild calcific atherosclerosis of the carotid bifurcation without hemodynamically significant stenosis by NASCET criteria. Normal appearance of the internal carotid artery. VERTEBRAL ARTERIES:Patent codominant vertebral arteries. Moderate stenosis LEFT vertebral artery origin. SKELETON: No acute osseous process though bone windows have not been submitted. Old manubrial fracture. OTHER NECK: Soft tissues of the neck are nonacute though, not tailored for evaluation. 11 mm RIGHT thyroid nodule, no routine indicated follow-up by consensus. UPPER CHEST: Patchy RIGHT upper lobe subpleural consolidation. Mild centrilobular emphysema. No superior mediastinal lymphadenopathy. CTA HEAD FINDINGS: ANTERIOR CIRCULATION: Patent cervical internal carotid arteries, petrous, cavernous and supra clinoid internal carotid arteries. Moderate stenosis RIGHT cavernous ICA. 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 Patent anterior communicating artery. Patent anterior and middle cerebral arteries. No large vessel occlusion, significant stenosis, contrast extravasation. POSTERIOR CIRCULATION: Patent vertebral arteries, vertebrobasilar junction and basilar artery, as well as main branch vessels. Patent posterior cerebral arteries, mild luminal irregularity compatible with atherosclerosis. No large vessel occlusion, significant stenosis, contrast extravasation or aneurysm. VENOUS SINUSES: Major dural venous sinuses are patent though not tailored for evaluation on this angiographic examination. ANATOMIC VARIANTS: None. DELAYED PHASE: Moderately motion degraded series, no definite abnormal intracranial enhancement. MIP images reviewed. IMPRESSION: CT HEAD: 1. No acute intracranial process; no acute infarcts not apparent by CT. 2. Stable examination  including disproportionate mesial temporal lobe atrophy associated with neuro degenerative syndromes. CTA NECK: 1. No hemodynamically significant stenosis ICA's. 2. Moderate stenosis LEFT vertebral artery origin, patent vertebral arteries. 3. RIGHT upper lobe pneumonia. CTA HEAD: 1. No emergent large vessel occlusion. No cerebral artery flow-limiting stenosis. 2. Moderate stenosis RIGHT ICA. Emphysema (ICD10-J43.9).  Aortic Atherosclerosis (ICD10-I70.0). Electronically Signed   By: Pernell Dupre  Bloomer M.D.   On: 03/15/2018 00:15   Ct Cervical Spine Wo Contrast  Result Date: 03/13/2018 CLINICAL DATA:  Fall EXAM: CT HEAD WITHOUT CONTRAST CT CERVICAL SPINE WITHOUT CONTRAST TECHNIQUE: Multidetector CT imaging of the head and cervical spine was performed following the standard protocol without intravenous contrast. Multiplanar CT image reconstructions of the cervical spine were also generated. COMPARISON:  None. FINDINGS: CT HEAD FINDINGS Brain: There is no mass, hemorrhage or extra-axial collection. There is generalized atrophy without lobar predilection. There is hypoattenuation of the periventricular white matter, most commonly indicating chronic ischemic microangiopathy. Vascular: Atherosclerotic calcification of the internal carotid arteries at the skull base. No abnormal hyperdensity of the major intracranial arteries or dural venous sinuses. Skull: The visualized skull base, calvarium and extracranial soft tissues are normal. Sinuses/Orbits: No fluid levels or advanced mucosal thickening of the visualized paranasal sinuses. No mastoid or middle ear effusion. The orbits are normal. CT CERVICAL SPINE FINDINGS Alignment: No static subluxation. Facets are aligned. Occipital condyles are normally positioned. Skull base and vertebrae: No acute fracture. Soft tissues and spinal canal: No prevertebral fluid or swelling. No visible canal hematoma. Disc levels: No advanced spinal canal or neural foraminal stenosis. Upper  chest: No pneumothorax, pulmonary nodule or pleural effusion. Other: Normal visualized paraspinal cervical soft tissues. IMPRESSION: 1. No acute abnormality of the head or cervical spine. 2. Chronic small vessel ischemia and cerebral volume loss. Electronically Signed   By: Deatra Robinson M.D.   On: 03/13/2018 17:58   Mr Brain Wo Contrast  Result Date: 03/14/2018 CLINICAL DATA:  Initial evaluation for acute altered mental status. The EXAM: MRI HEAD WITHOUT CONTRAST TECHNIQUE: Multiplanar, multiecho pulse sequences of the brain and surrounding structures were obtained without intravenous contrast. COMPARISON:  Prior CT from 03/13/2018. FINDINGS: Brain: Examination mildly degraded by motion artifact. Diffuse prominence of the CSF containing spaces compatible with generalized cerebral atrophy, mildly advanced in nature. Confluent T2/FLAIR hyperintensity within the periventricular white matter most consistent with chronic small vessel ischemic disease, mild for age. There is minimal patchy diffusion abnormality involving the cortical and subcortical white matter within the right frontal lobe (series 5, image 81), consistent with small acute right MCA territory infarcts. A few additional punctate foci of diffusion abnormality within the bilateral cerebellar hemispheres (series 5, image 54, 53, 46), also consistent with acute/early subacute ischemic infarcts. Findings are likely embolic in nature. No associated hemorrhage or mass effect. No other evidence for acute or subacute ischemia. Gray-white matter differentiation otherwise maintained. No acute or chronic intracranial hemorrhage. No mass lesion, midline shift or mass effect. Diffuse ventricular prominence most like related global parenchymal volume loss. A degree of underlying NPH would be difficult to exclude. No extra-axial fluid collection. Pituitary gland within normal limits. Vascular: Major intracranial vascular flow voids maintained. Skull and upper  cervical spine: Craniocervical junction normal. Upper cervical spine within normal limits. Bone marrow signal intensity normal. No scalp soft tissue abnormality. Sinuses/Orbits: Patient status post ocular lens replacement bilaterally. Paranasal sinuses are clear. Trace opacity left mastoid air cells, of doubtful significance. Other: End. IMPRESSION: 1. Few scattered patchy small volume acute to early subacute ischemic infarcts involving the cortical subcortical right frontal lobe as well as the bilateral cerebellar hemispheres as above. Findings likely embolic in nature. No associated hemorrhage. 2. No other acute intracranial abnormality. 3. Mildly advanced cerebral atrophy with chronic small vessel ischemic disease. 4. Diffuse ventriculomegaly. While this finding may be related to underlying global atrophy, a component of NPH may be contributory,  and could be considered in the correct clinical setting. Electronically Signed   By: Rise Mu M.D.   On: 03/14/2018 05:57   Vas US Carotid (at [facility] Only)  Result Date: 03/14/2018 Carotid Arterial Duplex Study Indications: CVA. Performing Technologist: Chanda Busing RVT  Examination Guidelines: A complete evaluation includes B-mode imaging, spectral Doppler, color Doppler, and power Doppler as needed of all accessible portions of each vessel. Bilateral testing is considered an integral part of a complete examination. Limited examinations for reoccurring indications may be performed as noted.  Right Carotid Findings: +----------+--------+-------+--------+----------------------+------------------+           PSV cm/sEDV    StenosisDescribe              Comments                             cm/s                                                    +----------+--------+-------+--------+----------------------+------------------+ CCA Prox  120     15                                                       +----------+--------+-------+--------+----------------------+------------------+ CCA Distal70      10                                   intimal thickening +----------+--------+-------+--------+----------------------+------------------+ ICA Prox  98      19             smooth and                                                                heterogenous                             +----------+--------+-------+--------+----------------------+------------------+ ICA Distal75      10                                   tortuous           +----------+--------+-------+--------+----------------------+------------------+ ECA       65      0                                                       +----------+--------+-------+--------+----------------------+------------------+ +----------+--------+-------+--------+-------------------+           PSV cm/sEDV cmsDescribeArm Pressure (mmHG) +----------+--------+-------+--------+-------------------+ [id number]                                        +----------+--------+-------+--------+-------------------+ +---------+--------+--+--------+--+---------+  VertebralPSV cm/s64EDV cm/s13Antegrade +---------+--------+--+--------+--+---------+  Left Carotid Findings: +----------+--------+-------+--------+----------------------+------------------+           PSV cm/sEDV    StenosisDescribe              Comments                             cm/s                                                    +----------+--------+-------+--------+----------------------+------------------+ CCA Prox  98      12                                   intimal thickening +----------+--------+-------+--------+----------------------+------------------+ CCA Distal70      10                                   intimal thickening +----------+--------+-------+--------+----------------------+------------------+ ICA Prox  67      8               smooth and                                                                heterogenous                             +----------+--------+-------+--------+----------------------+------------------+ ICA Distal78      9                                    tortuous           +----------+--------+-------+--------+----------------------+------------------+ ECA       85      0                                                       +----------+--------+-------+--------+----------------------+------------------+ +----------+--------+--------+--------+-------------------+ SubclavianPSV cm/sEDV cm/sDescribeArm Pressure (mmHG) +----------+--------+--------+--------+-------------------+           150                                         +----------+--------+--------+--------+-------------------+ +---------+--------+--+--------+--+---------+ VertebralPSV cm/s54EDV cm/s12Antegrade +---------+--------+--+--------+--+---------+  Summary: Right Carotid: Velocities in the right ICA are consistent with a 1-39% stenosis. Left Carotid: Velocities in the left ICA are consistent with a 1-39% stenosis. Vertebrals: Bilateral vertebral arteries demonstrate antegrade flow. *See table(s) above for measurements and observations.  Electronically signed by Delia Heady MD on 03/14/2018 at 2:02:28 PM.    Final         Scheduled Meds: . aspirin EC  325 mg Oral Daily  . enoxaparin (LOVENOX) injection  40 mg Subcutaneous Q24H  . sodium chloride flush  3 mL Intravenous Q12H   Continuous Infusions:   LOS: 1 day    Time spent:    Zannie Cove, MD [facility] Page via [email or web address], password [email or web address] After 7PM please contact night-coverage  03/15/2018, 11:08 AM

## 2018-03-15 NOTE — Evaluation (Signed)
Speech Language Pathology Evaluation Patient Details Name: Nina Bell MRN: [id number] DOB: 1935-04-08 Today's Date: 03/15/2018 Time: 1191-4782 SLP Time Calculation (min) (ACUTE ONLY): 33 min  Problem List:  Patient Active Problem List   Diagnosis Date Noted  . Pressure injury of skin 03/14/2018  . CVA (cerebral vascular accident) (HCC) 03/14/2018  . AMS (altered mental status) 03/14/2018  . Altered mental status 03/13/2018  . Hypertension 03/13/2018  . Dementia (HCC) 03/13/2018   Past Medical History:  Past Medical History:  Diagnosis Date  . Arthritis   . Dementia (HCC)   . Hypertension    Past Surgical History:  Past Surgical History:  Procedure Laterality Date  . CHOLECYSTECTOMY     HPI:  pt is an 83 yo female adm to [facility] with flat facies and lips sealed as well as increased delay in communication.  Pt found to have an acute to subacute bilateral cerebellar cva, advanced atrophy.  Subacute emboli.  Pt has h/o dementia and resides at [facility]. Daughter reports she has maxed out on care level at her Assisted Living.     Assessment / Plan / Recommendation Clinical Impression  Patient has baseline cognitive deficits due to her dementia. She is able to verbalize her basic needs without difficulties. Very delayed responses in answering questions if she answers and she is oriented to her self and daughter.  Daughter reports pt with increased difficulty/delays with responses with this cva.  Pt was working with SLP previously per daughter on routines, checklists etc and daughter states this was helpful.  Recommend follow up at next venue of care given pt with increased delay, further sudden changes with this event.  Encouraged daughter to have pt use her call bell for needs to help her generalize the skill.      SLP Assessment  SLP Recommendation/Assessment: All further Speech Lanaguage Pathology  needs can be addressed in the next venue of care SLP Visit Diagnosis:  Cognitive communication deficit (R41.841)    Follow Up Recommendations  Other (comment)(at next venue of care)    Frequency and Duration           SLP Evaluation Cognition  Overall Cognitive Status: Impaired/Different from baseline Arousal/Alertness: Awake/alert Orientation Level: Oriented to person Attention: Sustained Sustained Attention: Impaired Sustained Attention Impairment: Verbal basic Memory: Impaired Memory Impairment: Storage deficit;Retrieval deficit;Decreased short term memory;Decreased long term memory;Decreased recall of new information Decreased Long Term Memory: Verbal basic Decreased Short Term Memory: Verbal basic(with cues pt can recall she called for medication within 15 minutes) Awareness: Impaired Awareness Impairment: Intellectual impairment Problem Solving: Impaired Problem Solving Impairment: Functional basic(pt able to state need to call for assist but needs cues to perform) Safety/Judgment: Impaired       Comprehension  Auditory Comprehension Commands: Within Functional Limits(1, 2 step directions) Conversation: Simple Other Conversation Comments: pt with delayed responses to SLP questions, ? amount is receptive vs expressive language deficits, she answers questions but does not offer novel information Interfering Components: Processing speed Visual Recognition/Discrimination Discrimination: Not tested Reading Comprehension Reading Status: Not tested    Expression Expression Primary Mode of Expression: Verbal Verbal Expression Overall Verbal Expression: (for basic) Initiation: No impairment Level of Generative/Spontaneous Verbalization: Phrase Repetition: (dnt) Naming: No impairment Pragmatics: Unable to assess(delayed responses if pt responds at all) Written Expression Dominant Hand: Right Written Expression: Not tested   Oral / Motor  Oral Motor/Sensory Function Overall Oral Motor/Sensory Function: Within functional limits Motor  Speech Overall Motor Speech: Appears within functional limits for tasks  assessed Respiration: Within functional limits Resonance: Within functional limits Articulation: Within functional limitis Intelligibility: Intelligible Motor Planning: Witnin functional limits   GO                    Chales Abrahams 03/15/2018, 1:41 PM  Donavan Burnet, MS [facility] SLP Acute Rehab Services Pager [phone] Office [phone]'

## 2018-03-16 DIAGNOSIS — E785 Hyperlipidemia, unspecified: Secondary | ICD-10-CM

## 2018-03-16 DIAGNOSIS — I059 Rheumatic mitral valve disease, unspecified: Secondary | ICD-10-CM

## 2018-03-16 NOTE — Progress Notes (Addendum)
   [facility] has been requested to perform a transesophageal echocardiogram on this patient for stroke and abnormal mitral valve appearance.  The patient herself is not able to consent due to dementia. 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 Therefore I discussed procedure with Amy. The risks and benefits of transesophageal echocardiogram have been explained including risks of esophageal damage, perforation (1:10,000 risk), bleeding, pharyngeal hematoma as well as other potential complications associated with conscious sedation including aspiration, arrhythmia, respiratory failure and death. Alternatives to treatment were discussed, questions were answered. Amy fully consents to the procedure and this documentation would serve as informed consent. Amy will be up at the hospital all day tomorrow per her report.  Endoscopy is closed on the weekends so I cannot officially schedule this, but patient's name was submitted to [employer] to arrange tomorrow along with other requests that came in this weekend. I do not have the current ability to determine if this can be accommodated on schedule tomorrow but have made patient NPO after midnight. Formal orders will follow from our team once patient is officially scheduled.   Of note, the request that hit [employer] inbox only said TEE but IM note mentions loop. This will need to be clarified by neurology team tomorrow.  Dayna Dunn PA-C

## 2018-03-16 NOTE — Progress Notes (Signed)
STROKE TEAM PROGRESS NOTE   SUBJECTIVE (INTERVAL HISTORY) No family is at the bedside.  Pt sitting in chair for lunch. Not in distress, no acute event overnight. Pending TEE next week.    OBJECTIVE Vitals:   03/15/18 2348 03/16/18 0441 03/16/18 0822 03/16/18 1152  BP: (!) 182/72 (!) 181/59 (!) 167/67 (!) 161/70  Pulse: 92 85 85 97  Resp: 18 18 16 16   Temp: 97.7 F (36.5 C) 98.1 F (36.7 C) 98.3 F (36.8 C) 98.6 F (37 C)  TempSrc: Oral Oral Oral Oral  SpO2: 94% 94% 97% 97%  Weight:      Height:        CBC:  Recent Labs  Lab 03/13/18 1806 03/14/18 0531  WBC 11.4* 9.1  NEUTROABS 7.5  --   HGB 12.8 11.4*  HCT 38.9 34.3*  MCV 96.3 94.0  PLT 312 223    Basic Metabolic Panel:  Recent Labs  Lab 03/13/18 1806 03/14/18 0531  NA 138 139  K 4.4 3.7  CL 106 110  CO2 23 24  GLUCOSE 109* 96  BUN 19 12  CREATININE 0.98 0.76  CALCIUM 9.1 8.3*    Lipid Panel:     Component Value Date/Time   CHOL 184 03/15/2018 0434   TRIG 94 03/15/2018 0434   HDL 36 (L) 03/15/2018 0434   CHOLHDL 5.1 03/15/2018 0434   VLDL 19 03/15/2018 0434   LDLCALC 129 (H) 03/15/2018 0434   HgbA1c:  Lab Results  Component Value Date   HGBA1C 5.0 03/15/2018   Urine Drug Screen: No results found for: LABOPIA, COCAINSCRNUR, LABBENZ, AMPHETMU, THCU, LABBARB  Alcohol Level No results found for: ETH     IMAGING  Ct Angio Head W Or Wo Contrast 03/15/2018 IMPRESSION:   CT HEAD:  1. No acute intracranial process; no acute infarcts not apparent by CT.  2. Stable examination including disproportionate mesial temporal lobe atrophy associated with neuro degenerative syndromes.   CTA NECK:  1. No hemodynamically significant stenosis ICA's.  2. Moderate stenosis LEFT vertebral artery origin, patent vertebral arteries.  3. RIGHT upper lobe pneumonia.   CTA HEAD:  1. No emergent large vessel occlusion. No cerebral artery flow-limiting stenosis.  2. Moderate stenosis RIGHT ICA.   Emphysema  (ICD10-J43.9).    Aortic Atherosclerosis (ICD10-I70.0).    Ct Head Wo Contrast 03/13/2018 IMPRESSION:  1. No acute abnormality of the head or cervical spine.  2. Chronic small vessel ischemia and cerebral volume loss.    Mr Brain Wo Contrast 03/14/2018 IMPRESSION:  1. Few scattered patchy small volume acute to early subacute ischemic infarcts involving the cortical subcortical right frontal lobe as well as the bilateral cerebellar hemispheres as above. Findings likely embolic in nature. No associated hemorrhage.  2. No other acute intracranial abnormality.  3. Mildly advanced cerebral atrophy with chronic small vessel ischemic disease.  4. Diffuse ventriculomegaly. While this finding may be related to underlying global atrophy, a component of NPH may be contributory, and could be considered in the correct clinical setting.   Vas US Carotid (at [facility]) 03/14/2018 Summary:  Right Carotid: Velocities in the right ICA are consistent with a 1-39% stenosis.  Left Carotid: Velocities in the left ICA are consistent with a 1-39% stenosis.  Vertebrals: Bilateral vertebral arteries demonstrate antegrade flow.    Transthoracic Echocardiogram  03/14/2018 Study Conclusions - Left ventricle: The cavity size was normal. Wall thickness was   normal. Systolic function was normal. The estimated ejection   fraction  was in the range of 60% to 65%. Wall motion was normal;   there were no regional wall motion abnormalities. Doppler   parameters are consistent with abnormal left ventricular   relaxation (grade 1 diastolic dysfunction). - Aortic valve: There was no stenosis. There was trivial   regurgitation. - Mitral valve: Moderately calcified annulus. There appears to be   mobile calcification on the lateral annulus. - Left atrium: The atrium was mildly dilated. - Right ventricle: The cavity size was normal. Systolic function   was normal. - Pulmonary arteries: No complete TR doppler jet  so unable to   estimate PA systolic pressure. - Inferior vena cava: The vessel was normal in size. The   respirophasic diameter changes were in the normal range (>= 50%),   consistent with normal central venous pressure. Impressions: - Normal LV size with EF 60-65%. Normal RV size and systolic   function. There is moderate mitral annular calcification with the   appearance of mobile calcification on the lateral annulus. Given   history of CVA, would evaluate this with TEE.   PHYSICAL EXAM  Temp:  [97.7 F (36.5 C)-98.8 F (37.1 C)] 98.6 F (37 C) (11/03 1152) Pulse Rate:  [79-97] 97 (11/03 1152) Resp:  [16-20] 16 (11/03 1152) BP: (151-182)/(55-72) 161/70 (11/03 1152) SpO2:  [93 %-97 %] 97 % (11/03 1152)  General - Well nourished, well developed, in no apparent distress.  Ophthalmologic - fundi not visualized due to noncooperation.  Cardiovascular - Regular rate and rhythm.  Mental Status -  Level of arousal and orientation to place was intact, but not orientated to time or situation. Language including expression, repetition, comprehension was assessed and found intact. Naming 3/4  Fund of knowledge impaired.   Cranial Nerves II - XII - II - Visual field intact OU. III, IV, VI - Extraocular movements intact. V - Facial sensation intact bilaterally. VII - Facial movement intact bilaterally. VIII - Hearing & vestibular intact bilaterally. X - Palate elevates symmetrically. XI - Chin turning & shoulder shrug intact bilaterally. XII - Tongue protrusion intact.  Motor Strength - The patient's strength was normal in all extremities and pronator drift was absent.  Bulk was normal and fasciculations were absent.   Motor Tone - Muscle tone was assessed at the neck and appendages and was normal.  Reflexes - The patient's reflexes were symmetrical in all extremities and she had no pathological reflexes.  Sensory - Light touch, temperature/pinprick were assessed and were  symmetrical.    Coordination - The patient had normal movements in the hands with no ataxia or dysmetria.  Tremor was absent.  Gait and Station - deferred.    ASSESSMENT/PLAN Ms. Yamilette Garretson is a 83 y.o. female with history of Htn and dementia presenting with AMS, recent fall, lethargy, and leans to the right. She did not receive IV t-PA due to late presentation.  Strokes: bilateral cerebellum and left frontal small and punctate infarcts - embolic pattern -likely due to mobile MV calcification found on TTE  Resultant - disorientation  CT head  - no acute findings.  MRI head - Few scattered patchy small volume acute to early subacute ischemic infarcts involving the cortical subcortical right frontal lobe as well as the bilateral cerebellar hemispheres  CTA H&N - Moderate stenosis Lt VA origin and Rt intracranial ICA.  Carotid Doppler - unremarkable  2D Echo  - EF 60 - 65%. Mitral annular calcification with possible mobile calcification.   Requested TEE for further evaluation -  tentatively scheduled for Monday  LDL - 129  HgbA1c - 5.0  VTE prophylaxis - Lovenox  Diet -  - Heart healthy with thin liquids.  aspirin 81 mg daily prior to admission, now on aspirin 325 mg daily.   Patient counseled to be compliant with her antithrombotic medications  Ongoing aggressive stroke risk factor management  Therapy recommendations:  SNF  Disposition:  Pending  MV mobile calcification  Could be the source of stroke  Will have TEE next week  On ASA for now  Hypertension  Stable . Permissive hypertension (OK if < 220/120) but gradually normalize in 5-7 days . 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 After hours, contact General Neurology

## 2018-03-16 NOTE — Progress Notes (Signed)
PROGRESS NOTE    Nina Bell  ZOX:[id number] DOB: 1934-09-01 DOA: 03/13/2018 PCP: Florentina Jenny, MD  Brief Narrative: Nina Bell is an 83 y.o. female from ALF with medical history significantfor Dementia, HTN, and arthritiswho presented to the ED with change in mental status over the last 2 days. Daughter noticed that patient has been leaning more towards the right and has been very somnolent over the last day. She has also noticed a change in facial movement as if the patient is constantly "puckering" her lips. -On MRI was noted to have multifocal strokes   Assessment & Plan:   Multifocal strokes -likely embolic in origin -Neurology consulting -No significant deficits at this time -Continue aspirin daily -2D echocardiogram with mitral valve calcification, ? mobile, preserved ejection fraction,  -plan for TEE and loop recorder on Monday per Neuro -Carotid duplex with no significant stenosis -LDL is 129-add statin, HbA1c is 5.0 -PT/OT evaluations completed, SNF/supervision recommended  Mild dementia -Stable, from assisted living facility  Hypertension -Stable, permissive hypertension   Stage II pressure ulcer R buttock -Appreciate wound care input  DVT prophylaxis: Lovenox Code Status: DNR Family Communication: No family at bedside Disposition Plan: Likely back to ALF after work-up completed, ? Monday  Consultants:   Neurology   Procedures:   Antimicrobials:    Subjective: -no complaints, no events overnight  Objective: Vitals:   03/15/18 2348 03/16/18 0441 03/16/18 0822 03/16/18 1152  BP: (!) 182/72 (!) 181/59 (!) 167/67 (!) 161/70  Pulse: 92 85 85 97  Resp: 18 18 16 16   Temp: 97.7 F (36.5 C) 98.1 F (36.7 C) 98.3 F (36.8 C) 98.6 F (37 C)  TempSrc: Oral Oral Oral Oral  SpO2: 94% 94% 97% 97%  Weight:      Height:        Intake/Output Summary (Last 24 hours) at 03/16/2018 1333 Last data filed at 03/16/2018 1048 Gross per 24 hour  Intake  120 ml  Output 650 ml  Net -530 ml   Filed Weights   03/13/18 1637 03/14/18 0100 03/14/18 0458  Weight: 65.8 kg 59 kg 59 kg    Examination:  Gen: Awake, Alert, Oriented X 3, no distress, oriented x2 HEENT: PERRLA, Neck supple, no JVD Lungs: Good air movement bilaterally, CTAB CVS: RRR,No Gallops,Rubs or new Murmurs Abd: soft, Non tender, non distended, BS present Extremities: No edema Skin: no new rashes Central nervous system: Alert and oriented, moves all extremities, no localizing signs, plantars are withdrawal, DTR 1+.  Data Reviewed:   CBC: Recent Labs  Lab 03/13/18 1806 03/14/18 0531  WBC 11.4* 9.1  NEUTROABS 7.5  --   HGB 12.8 11.4*  HCT 38.9 34.3*  MCV 96.3 94.0  PLT 312 223   Basic Metabolic Panel: Recent Labs  Lab 03/13/18 1806 03/14/18 0531  NA 138 139  K 4.4 3.7  CL 106 110  CO2 23 24  GLUCOSE 109* 96  BUN 19 12  CREATININE 0.98 0.76  CALCIUM 9.1 8.3*   GFR: Estimated Creatinine Clearance: 50.5 mL/min (by C-G formula based on SCr of 0.76 mg/dL). Liver Function Tests: Recent Labs  Lab 03/13/18 1806  AST 19  ALT 18  ALKPHOS 65  BILITOT 0.5  PROT 7.0  ALBUMIN 3.5   No results for input(s): LIPASE, AMYLASE in the last 168 hours. No results for input(s): AMMONIA in the last 168 hours. Coagulation Profile: Recent Labs  Lab 03/13/18 1806  INR 1.01   Cardiac Enzymes: Recent Labs  Lab 03/13/18 2350 03/14/18  0531  TROPONINI 0.07* 0.06*   BNP (last 3 results) No results for input(s): PROBNP in the last 8760 hours. HbA1C: Recent Labs    03/15/18 0434  HGBA1C 5.0   CBG: Recent Labs  Lab 03/13/18 1804  GLUCAP 99   Lipid Profile: Recent Labs    03/15/18 0434  CHOL 184  HDL 36*  LDLCALC 129*  TRIG 94  CHOLHDL 5.1   Thyroid Function Tests: Recent Labs    03/13/18 2350  TSH 1.064   Anemia Panel: No results for input(s): VITAMINB12, FOLATE, FERRITIN, TIBC, IRON, RETICCTPCT in the last 72 hours. Urine analysis:      Component Value Date/Time   COLORURINE YELLOW 03/13/2018 2135   APPEARANCEUR CLEAR 03/13/2018 2135   LABSPEC 1.012 03/13/2018 2135   PHURINE 6.0 03/13/2018 2135   GLUCOSEU NEGATIVE 03/13/2018 2135   HGBUR NEGATIVE 03/13/2018 2135   BILIRUBINUR NEGATIVE 03/13/2018 2135   KETONESUR NEGATIVE 03/13/2018 2135   PROTEINUR NEGATIVE 03/13/2018 2135   NITRITE NEGATIVE 03/13/2018 2135   LEUKOCYTESUR NEGATIVE 03/13/2018 2135   Sepsis Labs: @LABRCNTIP (procalcitonin:4,lacticidven:4)  ) Recent Results (from the past 240 hour(s))  MRSA PCR Screening     Status: None   Collection Time: 03/14/18  2:08 AM  Result Value Ref Range Status   MRSA by PCR NEGATIVE NEGATIVE Final    Comment:        The GeneXpert MRSA Assay (FDA approved for NASAL specimens only), is one component of a comprehensive MRSA colonization surveillance program. It is not intended to diagnose MRSA infection nor to guide or monitor treatment for MRSA infections. Performed at [facility], [address] [place], [place] [address]          Radiology Studies: Ct Angio Head W Or Wo Contrast  Result Date: 03/15/2018 CLINICAL DATA:  Stroke follow-up. History of hypertension and dementia. EXAM: CT ANGIOGRAPHY HEAD AND NECK TECHNIQUE: Multidetector CT imaging of the head and neck was performed using the standard protocol during bolus administration of intravenous contrast. Multiplanar CT image reconstructions and MIPs were obtained to evaluate the vascular anatomy. Carotid stenosis measurements (when applicable) are obtained utilizing NASCET criteria, using the distal internal carotid diameter as the denominator. CONTRAST:  50mL ISOVUE-370 IOPAMIDOL (ISOVUE-370) INJECTION 76% COMPARISON:  CT HEAD March 13, 2018 and MRI head March 14, 2018 FINDINGS: CT HEAD FINDINGS BRAIN: No intraparenchymal hemorrhage, mass effect nor midline shift. Moderate to severe ventriculomegaly with disproportionate mesial temporal lobe  atrophy and ex vacuo dilatation temporal horns. Patchy supratentorial white matter hypodensities compatible with moderate chronic small vessel ischemic changes, unchanged. No acute large vascular territory infarcts. No abnormal extra-axial fluid collections. Basal cisterns are patent. VASCULAR: Moderate calcific atherosclerosis of the carotid siphons. SKULL: No skull fracture. No significant scalp soft tissue swelling. SINUSES/ORBITS: Small LEFT maxillary sinus mucosal retention cyst. Mastoid air cells are well aerated.The included ocular globes and orbital contents are non-suspicious. Status post bilateral ocular lens implants. OTHER: None. CTA NECK FINDINGS: AORTIC ARCH: Normal appearance of the thoracic arch, normal branch pattern. Moderate calcific atherosclerosis aortic arch the origins of the innominate, left Common carotid artery and subclavian artery are patent. Mild stenosis LEFT common carotid artery and LEFT subclavian artery origins. RIGHT CAROTID SYSTEM: Common carotid artery is patent. Mild calcific atherosclerosis of the carotid bifurcation without hemodynamically significant stenosis by NASCET criteria. Normal appearance of the internal carotid artery. LEFT CAROTID SYSTEM: Common carotid artery is patent. Mild calcific atherosclerosis of the carotid bifurcation without hemodynamically significant stenosis by NASCET  criteria. Normal appearance of the internal carotid artery. VERTEBRAL ARTERIES:Patent codominant vertebral arteries. Moderate stenosis LEFT vertebral artery origin. SKELETON: No acute osseous process though bone windows have not been submitted. Old manubrial fracture. OTHER NECK: Soft tissues of the neck are nonacute though, not tailored for evaluation. 11 mm RIGHT thyroid nodule, no routine indicated follow-up by consensus. UPPER CHEST: Patchy RIGHT upper lobe subpleural consolidation. Mild centrilobular emphysema. No superior mediastinal lymphadenopathy. CTA HEAD FINDINGS: ANTERIOR  CIRCULATION: Patent cervical internal carotid arteries, petrous, cavernous and supra clinoid internal carotid arteries. Moderate stenosis RIGHT cavernous ICA. 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 Patent anterior communicating artery. Patent anterior and middle cerebral arteries. No large vessel occlusion, significant stenosis, contrast extravasation. POSTERIOR CIRCULATION: Patent vertebral arteries, vertebrobasilar junction and basilar artery, as well as main branch vessels. Patent posterior cerebral arteries, mild luminal irregularity compatible with atherosclerosis. No large vessel occlusion, significant stenosis, contrast extravasation or aneurysm. VENOUS SINUSES: Major dural venous sinuses are patent though not tailored for evaluation on this angiographic examination. ANATOMIC VARIANTS: None. DELAYED PHASE: Moderately motion degraded series, no definite abnormal intracranial enhancement. MIP images reviewed. IMPRESSION: CT HEAD: 1. No acute intracranial process; no acute infarcts not apparent by CT. 2. Stable examination including disproportionate mesial temporal lobe atrophy associated with neuro degenerative syndromes. CTA NECK: 1. No hemodynamically significant stenosis ICA's. 2. Moderate stenosis LEFT vertebral artery origin, patent vertebral arteries. 3. RIGHT upper lobe pneumonia. CTA HEAD: 1. No emergent large vessel occlusion. No cerebral artery flow-limiting stenosis. 2. Moderate stenosis RIGHT ICA. Emphysema (ICD10-J43.9).  Aortic Atherosclerosis (ICD10-I70.0). Electronically Signed   By: Awilda Metro M.D.   On: 03/15/2018 00:15   Ct Angio Neck W Or Wo Contrast  Result Date: 03/15/2018 CLINICAL DATA:  Stroke follow-up. History of hypertension and dementia. EXAM: CT ANGIOGRAPHY HEAD AND NECK TECHNIQUE: Multidetector CT imaging of the head and neck was performed using the standard protocol during bolus administration of intravenous contrast. Multiplanar CT image reconstructions and MIPs  were obtained to evaluate the vascular anatomy. Carotid stenosis measurements (when applicable) are obtained utilizing NASCET criteria, using the distal internal carotid diameter as the denominator. CONTRAST:  50mL ISOVUE-370 IOPAMIDOL (ISOVUE-370) INJECTION 76% COMPARISON:  CT HEAD March 13, 2018 and MRI head March 14, 2018 FINDINGS: CT HEAD FINDINGS BRAIN: No intraparenchymal hemorrhage, mass effect nor midline shift. Moderate to severe ventriculomegaly with disproportionate mesial temporal lobe atrophy and ex vacuo dilatation temporal horns. Patchy supratentorial white matter hypodensities compatible with moderate chronic small vessel ischemic changes, unchanged. No acute large vascular territory infarcts. No abnormal extra-axial fluid collections. Basal cisterns are patent. VASCULAR: Moderate calcific atherosclerosis of the carotid siphons. SKULL: No skull fracture. No significant scalp soft tissue swelling. SINUSES/ORBITS: Small LEFT maxillary sinus mucosal retention cyst. Mastoid air cells are well aerated.The included ocular globes and orbital contents are non-suspicious. Status post bilateral ocular lens implants. OTHER: None. CTA NECK FINDINGS: AORTIC ARCH: Normal appearance of the thoracic arch, normal branch pattern. Moderate calcific atherosclerosis aortic arch the origins of the innominate, left Common carotid artery and subclavian artery are patent. Mild stenosis LEFT common carotid artery and LEFT subclavian artery origins. RIGHT CAROTID SYSTEM: Common carotid artery is patent. Mild calcific atherosclerosis of the carotid bifurcation without hemodynamically significant stenosis by NASCET criteria. Normal appearance of the internal carotid artery. LEFT CAROTID SYSTEM: Common carotid artery is patent. Mild calcific atherosclerosis of the carotid bifurcation without hemodynamically significant stenosis by NASCET criteria. Normal appearance of the internal carotid artery. VERTEBRAL ARTERIES:Patent  codominant vertebral  arteries. Moderate stenosis LEFT vertebral artery origin. SKELETON: No acute osseous process though bone windows have not been submitted. Old manubrial fracture. OTHER NECK: Soft tissues of the neck are nonacute though, not tailored for evaluation. 11 mm RIGHT thyroid nodule, no routine indicated follow-up by consensus. UPPER CHEST: Patchy RIGHT upper lobe subpleural consolidation. Mild centrilobular emphysema. No superior mediastinal lymphadenopathy. CTA HEAD FINDINGS: ANTERIOR CIRCULATION: Patent cervical internal carotid arteries, petrous, cavernous and supra clinoid internal carotid arteries. Moderate stenosis RIGHT cavernous ICA. 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 Patent anterior communicating artery. Patent anterior and middle cerebral arteries. No large vessel occlusion, significant stenosis, contrast extravasation. POSTERIOR CIRCULATION: Patent vertebral arteries, vertebrobasilar junction and basilar artery, as well as main branch vessels. Patent posterior cerebral arteries, mild luminal irregularity compatible with atherosclerosis. No large vessel occlusion, significant stenosis, contrast extravasation or aneurysm. VENOUS SINUSES: Major dural venous sinuses are patent though not tailored for evaluation on this angiographic examination. ANATOMIC VARIANTS: None. DELAYED PHASE: Moderately motion degraded series, no definite abnormal intracranial enhancement. MIP images reviewed. IMPRESSION: CT HEAD: 1. No acute intracranial process; no acute infarcts not apparent by CT. 2. Stable examination including disproportionate mesial temporal lobe atrophy associated with neuro degenerative syndromes. CTA NECK: 1. No hemodynamically significant stenosis ICA's. 2. Moderate stenosis LEFT vertebral artery origin, patent vertebral arteries. 3. RIGHT upper lobe pneumonia. CTA HEAD: 1. No emergent large vessel occlusion. No cerebral artery flow-limiting stenosis. 2. Moderate stenosis RIGHT ICA.  Emphysema (ICD10-J43.9).  Aortic Atherosclerosis (ICD10-I70.0). Electronically Signed   By: Awilda Metro M.D.   On: 03/15/2018 00:15        Scheduled Meds: . aspirin EC  325 mg Oral Daily  . atorvastatin  40 mg Oral q1800  . enoxaparin (LOVENOX) injection  40 mg Subcutaneous Q24H  . sodium chloride flush  3 mL Intravenous Q12H   Continuous Infusions:   LOS: 2 days    Time spent:    Zannie Cove, MD [facility] Page via [email or web address], password [email or web address] After 7PM please contact night-coverage  03/16/2018, 1:33 PM

## 2018-03-16 NOTE — Plan of Care (Signed)
Patient stable, unable to participate with POC at this time.

## 2018-03-17 ENCOUNTER — Encounter (HOSPITAL_COMMUNITY): Payer: Self-pay | Admitting: *Deleted

## 2018-03-17 ENCOUNTER — Encounter (HOSPITAL_COMMUNITY)
Admission: EM | Disposition: A | Discharge status: Skilled Nursing Facility | Payer: Self-pay | Source: Home / Self Care | Attending: Internal Medicine

## 2018-03-17 ENCOUNTER — Inpatient Hospital Stay (HOSPITAL_COMMUNITY): Payer: Medicare Other | Admitting: Anesthesiology

## 2018-03-17 ENCOUNTER — Inpatient Hospital Stay (HOSPITAL_COMMUNITY): Payer: Medicare Other

## 2018-03-17 DIAGNOSIS — I639 Cerebral infarction, unspecified: Secondary | ICD-10-CM

## 2018-03-17 HISTORY — PX: TEE WITHOUT CARDIOVERSION: SHX5443

## 2018-03-17 SURGERY — ECHOCARDIOGRAM, TRANSESOPHAGEAL
Anesthesia: Monitor Anesthesia Care

## 2018-03-17 MED ORDER — PROMETHAZINE HCL 25 MG/ML IJ SOLN: 6.2500 mg | INTRAMUSCULAR | Status: DC | PRN

## 2018-03-17 MED ORDER — ASPIRIN EC 81 MG PO TBEC
81.0000 mg | DELAYED_RELEASE_TABLET | Freq: Every day | ORAL | Status: DC
  Administered 2018-03-19: 81 mg via ORAL
  Filled 2018-03-17: qty 1

## 2018-03-17 MED ORDER — PSYLLIUM 95 % PO PACK
1.0000 | PACK | Freq: Every day | ORAL | Status: DC
  Administered 2018-03-18: 1 via ORAL
  Filled 2018-03-17 (×2): qty 1

## 2018-03-17 MED ORDER — ADULT MULTIVITAMIN W/MINERALS CH
1.0000 | ORAL_TABLET | Freq: Every day | ORAL | Status: DC
  Administered 2018-03-17 – 2018-03-19 (×2): 1 via ORAL
  Filled 2018-03-17 (×2): qty 1

## 2018-03-17 MED ORDER — ATENOLOL 50 MG PO TABS
50.0000 mg | ORAL_TABLET | Freq: Every day | ORAL | Status: DC
  Administered 2018-03-17 – 2018-03-19 (×2): 50 mg via ORAL
  Filled 2018-03-17 (×3): qty 1
  Filled 2018-03-17 (×2): qty 2

## 2018-03-17 MED ORDER — LACTATED RINGERS IV SOLN
INTRAVENOUS | Status: DC
  Administered 2018-03-17: 1000 mL via INTRAVENOUS

## 2018-03-17 MED ORDER — CLOPIDOGREL BISULFATE 75 MG PO TABS
75.0000 mg | ORAL_TABLET | Freq: Every day | ORAL | Status: DC
  Administered 2018-03-17 – 2018-03-19 (×2): 75 mg via ORAL
  Filled 2018-03-17 (×2): qty 1

## 2018-03-17 MED ORDER — HYDRALAZINE HCL 20 MG/ML IJ SOLN
5.0000 mg | Freq: Once | INTRAMUSCULAR | Status: AC
  Administered 2018-03-17: 5 mg via INTRAVENOUS
  Filled 2018-03-17: qty 1

## 2018-03-17 MED ORDER — LIDOCAINE HCL (CARDIAC) PF 100 MG/5ML IV SOSY
PREFILLED_SYRINGE | INTRAVENOUS | Status: DC | PRN
  Administered 2018-03-17: 60 mg via INTRAVENOUS

## 2018-03-17 MED ORDER — ASPIRIN 81 MG PO CHEW: 81.0000 mg | CHEWABLE_TABLET | Freq: Every day | ORAL | Status: DC

## 2018-03-17 MED ORDER — PROPOFOL 500 MG/50ML IV EMUL
INTRAVENOUS | Status: DC | PRN
  Administered 2018-03-17: 75 ug/kg/min via INTRAVENOUS

## 2018-03-17 MED ORDER — PSYLLIUM 0.52 G PO CAPS: 0.5200 g | ORAL_CAPSULE | Freq: Every day | ORAL | Status: DC

## 2018-03-17 MED ORDER — PROPOFOL 10 MG/ML IV BOLUS
INTRAVENOUS | Status: DC | PRN
  Administered 2018-03-17: 15 mg via INTRAVENOUS

## 2018-03-17 MED ORDER — ACETAMINOPHEN 325 MG PO TABS
650.0000 mg | ORAL_TABLET | Freq: Every day | ORAL | Status: DC | PRN
  Administered 2018-03-17 – 2018-03-18 (×2): 650 mg via ORAL

## 2018-03-17 MED ORDER — LOSARTAN POTASSIUM 50 MG PO TABS
50.0000 mg | ORAL_TABLET | Freq: Every day | ORAL | Status: DC
  Administered 2018-03-17 – 2018-03-19 (×2): 50 mg via ORAL
  Filled 2018-03-17 (×2): qty 1

## 2018-03-17 NOTE — Progress Notes (Signed)
PROGRESS NOTE    Clarke Amburn  ZOX:[id number] DOB: Oct 02, 1934 DOA: 03/13/2018 PCP: Florentina Jenny, MD  Brief Narrative: Nina Bell is an 83 y.o. female from ALF with medical history significantfor Dementia, HTN, and arthritiswho presented to the ED with change in mental status over the last 2 days. Daughter noticed that patient has been leaning more towards the right and has been very somnolent over the last day. She has also noticed a change in facial movement as if the patient is constantly "puckering" her lips. -On MRI was noted to have multifocal strokes  Assessment & Plan:   Multifocal strokes -likely embolic in origin, MRI noted scattered patchy acute to subacute ischemic infarcts involving subcortical right frontal lobe and bilateral cerebellar hemispheres -CTA head and neck with moderate stenosis left vertebral artery origin and right intracranial ICA -Neurology consulting -No significant deficits at this time -Continue aspirin 325 mg daily -2D echocardiogram with mitral valve calcification, ? mobile, preserved ejection fraction,  -TEE today per cardiology -Carotid duplex with no significant stenosis -LDL is 129-add statin, HbA1c is 5.0 -PT/OT evaluations completed, SNF/supervision recommended, and for discharge back to assisted living facility with home health services pending above work-up  Mild dementia -Stable, from assisted living facility  Hypertension -Stable, permissive hypertension   Stage II pressure ulcer R buttock -Appreciate wound care input  DVT prophylaxis: Lovenox Code Status: DNR Family Communication: No family at bedside Disposition Plan: Likely back to ALF after work-up completed, later this evening versus a.m.  Consultants:   Neurology   Procedures: TEE this afternoon pending  Antimicrobials:    Subjective: -No events overnight, no new complaints  Objective: Vitals:   03/17/18 0545 03/17/18 1204 03/17/18 1300 03/17/18 1315    BP: (!) 157/41 (!) 180/63 (!) 140/54 (!) 167/53  Pulse:  (!) 106  83  Resp:  17 17 19   Temp:  (!) 97.3 F (36.3 C) 98 F (36.7 C)   TempSrc:  Axillary Oral   SpO2:  96% 100% 100%  Weight:      Height:        Intake/Output Summary (Last 24 hours) at 03/17/2018 1357 Last data filed at 03/17/2018 1255 Gross per 24 hour  Intake 620 ml  Output -  Net 620 ml   Filed Weights   03/13/18 1637 03/14/18 0100 03/14/18 0458  Weight: 65.8 kg 59 kg 59 kg    Examination:  Gen: Awake, Alert,  no distress, oriented x2 HEENT: PERRLA, Neck supple, no JVD Lungs: Good air movement bilaterally, CTAB CVS: RRR,No Gallops,Rubs or new Murmurs Abd: soft, Non tender, non distended, BS present Extremities: No edema Skin: no new rashes Central nervous system: Alert and oriented, moves all extremities, no localizing signs, plantars are withdrawal, DTR 1+.  Data Reviewed:   CBC: Recent Labs  Lab 03/13/18 1806 03/14/18 0531  WBC 11.4* 9.1  NEUTROABS 7.5  --   HGB 12.8 11.4*  HCT 38.9 34.3*  MCV 96.3 94.0  PLT 312 223   Basic Metabolic Panel: Recent Labs  Lab 03/13/18 1806 03/14/18 0531  NA 138 139  K 4.4 3.7  CL 106 110  CO2 23 24  GLUCOSE 109* 96  BUN 19 12  CREATININE 0.98 0.76  CALCIUM 9.1 8.3*   GFR: Estimated Creatinine Clearance: 50.5 mL/min (by C-G formula based on SCr of 0.76 mg/dL). Liver Function Tests: Recent Labs  Lab 03/13/18 1806  AST 19  ALT 18  ALKPHOS 65  BILITOT 0.5  PROT 7.0  ALBUMIN 3.5  No results for input(s): LIPASE, AMYLASE in the last 168 hours. No results for input(s): AMMONIA in the last 168 hours. Coagulation Profile: Recent Labs  Lab 03/13/18 1806  INR 1.01   Cardiac Enzymes: Recent Labs  Lab 03/13/18 2350 03/14/18 0531  TROPONINI 0.07* 0.06*   BNP (last 3 results) No results for input(s): PROBNP in the last 8760 hours. HbA1C: Recent Labs    03/15/18 0434  HGBA1C 5.0   CBG: Recent Labs  Lab 03/13/18 1804  GLUCAP 99    Lipid Profile: Recent Labs    03/15/18 0434  CHOL 184  HDL 36*  LDLCALC 129*  TRIG 94  CHOLHDL 5.1   Thyroid Function Tests: No results for input(s): TSH, T4TOTAL, FREET4, T3FREE, THYROIDAB in the last 72 hours. Anemia Panel: No results for input(s): VITAMINB12, FOLATE, FERRITIN, TIBC, IRON, RETICCTPCT in the last 72 hours. Urine analysis:    Component Value Date/Time   COLORURINE YELLOW 03/13/2018 2135   APPEARANCEUR CLEAR 03/13/2018 2135   LABSPEC 1.012 03/13/2018 2135   PHURINE 6.0 03/13/2018 2135   GLUCOSEU NEGATIVE 03/13/2018 2135   HGBUR NEGATIVE 03/13/2018 2135   BILIRUBINUR NEGATIVE 03/13/2018 2135   KETONESUR NEGATIVE 03/13/2018 2135   PROTEINUR NEGATIVE 03/13/2018 2135   NITRITE NEGATIVE 03/13/2018 2135   LEUKOCYTESUR NEGATIVE 03/13/2018 2135   Sepsis Labs: @LABRCNTIP (procalcitonin:4,lacticidven:4)  ) Recent Results (from the past 240 hour(s))  MRSA PCR Screening     Status: None   Collection Time: 03/14/18  2:08 AM  Result Value Ref Range Status   MRSA by PCR NEGATIVE NEGATIVE Final    Comment:        The GeneXpert MRSA Assay (FDA approved for NASAL specimens only), is one component of a comprehensive MRSA colonization surveillance program. It is not intended to diagnose MRSA infection nor to guide or monitor treatment for MRSA infections. Performed at [facility], [address] [place], [place] [address]          Radiology Studies: No results found.      Scheduled Meds: . aspirin  81 mg Oral Daily  . [MAR Hold] aspirin EC  325 mg Oral Daily  . atenolol  50 mg Oral Daily  . [MAR Hold] atorvastatin  40 mg Oral q1800  . [MAR Hold] enoxaparin (LOVENOX) injection  40 mg Subcutaneous Q24H  . losartan  50 mg Oral Daily  . multivitamin with minerals  1 tablet Oral Daily  . psyllium  0.52 g Oral QHS  . [MAR Hold] sodium chloride flush  3 mL Intravenous Q12H   Continuous Infusions:   LOS: 3 days    Time spent:     Zannie Cove, MD [facility] Page via [email or web address], password [email or web address] After 7PM please contact night-coverage  03/17/2018, 1:57 PM

## 2018-03-17 NOTE — NC FL2 (Signed)
[place] MEDICAID FL2 LEVEL OF CARE SCREENING TOOL     IDENTIFICATION  Patient Name: Nina Bell Birthdate: Feb 12, 1935 Sex: female Admission Date (Current Location): 03/13/2018  [facility] and [place] Number:  Producer, television/film/video and Address:  [facility], [address] [place], [place] [address]      Provider Number: [id number]  Attending Physician Name and Address:  Zannie Cove, MD  Relative Name and Phone Number:       Current Level of Care: Hospital Recommended Level of Care: Skilled Nursing Facility Prior Approval Number:    Date Approved/Denied:   PASRR Number: [id number]  Discharge Plan: SNF    Current Diagnoses: Patient Active Problem List   Diagnosis Date Noted  . Pressure injury of skin 03/14/2018  . CVA (cerebral vascular accident) (HCC) 03/14/2018  . AMS (altered mental status) 03/14/2018  . Altered mental status 03/13/2018  . Hypertension 03/13/2018  . Dementia (HCC) 03/13/2018    Orientation RESPIRATION BLADDER Height & Weight     Self  Normal Incontinent Weight: 130 lb (59 kg) Height:  5\' 5"  (165.1 cm)  BEHAVIORAL SYMPTOMS/MOOD NEUROLOGICAL BOWEL NUTRITION STATUS      Incontinent Diet(see DC summary)  AMBULATORY STATUS COMMUNICATION OF NEEDS Skin   Limited Assist Verbally PU Stage and Appropriate Care   PU Stage 2 Dressing: (right buttocks, foam dressing)                   Personal Care Assistance Level of Assistance  Bathing, Feeding, Dressing Bathing Assistance: Limited assistance Feeding assistance: Limited assistance Dressing Assistance: Limited assistance     Functional Limitations Info  Sight, Hearing, Speech Sight Info: Impaired(wears glasses) Hearing Info: Adequate Speech Info: Adequate    SPECIAL CARE FACTORS FREQUENCY  PT (By licensed PT), OT (By licensed OT), Speech therapy     PT Frequency: 5x/wk OT Frequency: 5x/wk     Speech Therapy Frequency: 5x/wk      Contractures  Contractures Info: Not present    Additional Factors Info  Code Status, Allergies Code Status Info: DNR Allergies Info: Penicillins           Current Medications (03/17/2018):  This is the current hospital active medication list Current Facility-Administered Medications  Medication Dose Route Frequency Provider Last Rate Last Dose  . [MAR Hold] acetaminophen (TYLENOL) tablet 650 mg  650 mg Oral Q6H PRN Charlsie Quest, MD   650 mg at 03/15/18 1223   Or  . [MAR Hold] acetaminophen (TYLENOL) suppository 650 mg  650 mg Rectal Q6H PRN Charlsie Quest, MD      . Mitzi Hansen Hold] aspirin EC tablet 325 mg  325 mg Oral Daily Marvel Plan, MD   325 mg at 03/17/18 0857  . [MAR Hold] atorvastatin (LIPITOR) tablet 40 mg  40 mg Oral q1800 Zannie Cove, MD   40 mg at 03/16/18 1817  . [MAR Hold] enoxaparin (LOVENOX) injection 40 mg  40 mg Subcutaneous Q24H Darreld Mclean R, MD   40 mg at 03/16/18 1205  . lactated ringers infusion   Intravenous Continuous Lars Masson, MD      . Mitzi Hansen Hold] sodium chloride flush (NS) 0.9 % injection 3 mL  3 mL Intravenous Q12H Charlsie Quest, MD   3 mL at 03/17/18 0857     Discharge Medications: Please see discharge summary for a list of discharge medications.  Relevant Imaging Results:  Relevant Lab Results:   Additional Information SS#: [id number]  Baldemar Lenis,  LCSW

## 2018-03-17 NOTE — Transfer of Care (Signed)
Immediate Anesthesia Transfer of Care Note  Patient: Nina Bell  Procedure(s) Performed: TRANSESOPHAGEAL ECHOCARDIOGRAM (TEE) (N/A ) BUBBLE STUDY  Patient Location: Endoscopy Unit  Anesthesia Type:MAC  Level of Consciousness: awake, alert  and oriented  Airway & Oxygen Therapy: Patient Spontanous Breathing and Patient connected to nasal cannula oxygen  Post-op Assessment: Report given to RN, Post -op Vital signs reviewed and stable and Patient moving all extremities X 4  Post vital signs: Reviewed and stable  Last Vitals:  Vitals Value Taken Time  BP    Temp    Pulse    Resp    SpO2      Last Pain:  Vitals:   03/17/18 1204  TempSrc: Axillary  PainSc: 0-No pain         Complications: No apparent anesthesia complications

## 2018-03-17 NOTE — Progress Notes (Signed)
STROKE TEAM PROGRESS NOTE   SUBJECTIVE (INTERVAL HISTORY) No family is at the bedside.  Pt sitting lying in bed. Not in distress, no acute event overnight. TEE done showed severe calcification of MV but no mentioning of mobile calcification. However, pt was found to have small PFO with ASA and positive bubble study. Will do LE venous doppler to rule out DVT. If that is negative, she needs Loop recorder.     OBJECTIVE Vitals:   03/17/18 1300 03/17/18 1315 03/17/18 1325 03/17/18 1613  BP: (!) 140/54 (!) 167/53 (!) 169/60 (!) 151/59  Pulse:  83 83 94  Resp: 17 19 19 16   Temp: 98 F (36.7 C)     TempSrc: Oral     SpO2: 100% 100% 100% 95%  Weight:      Height:        CBC:  Recent Labs  Lab 03/13/18 1806 03/14/18 0531  WBC 11.4* 9.1  NEUTROABS 7.5  --   HGB 12.8 11.4*  HCT 38.9 34.3*  MCV 96.3 94.0  PLT 312 223    Basic Metabolic Panel:  Recent Labs  Lab 03/13/18 1806 03/14/18 0531  NA 138 139  K 4.4 3.7  CL 106 110  CO2 23 24  GLUCOSE 109* 96  BUN 19 12  CREATININE 0.98 0.76  CALCIUM 9.1 8.3*    Lipid Panel:     Component Value Date/Time   CHOL 184 03/15/2018 0434   TRIG 94 03/15/2018 0434   HDL 36 (L) 03/15/2018 0434   CHOLHDL 5.1 03/15/2018 0434   VLDL 19 03/15/2018 0434   LDLCALC 129 (H) 03/15/2018 0434   HgbA1c:  Lab Results  Component Value Date   HGBA1C 5.0 03/15/2018   Urine Drug Screen: No results found for: LABOPIA, COCAINSCRNUR, LABBENZ, AMPHETMU, THCU, LABBARB  Alcohol Level No results found for: ETH     IMAGING  Ct Angio Head W Or Wo Contrast 03/15/2018 IMPRESSION:   CT HEAD:  1. No acute intracranial process; no acute infarcts not apparent by CT.  2. Stable examination including disproportionate mesial temporal lobe atrophy associated with neuro degenerative syndromes.   CTA NECK:  1. No hemodynamically significant stenosis ICA's.  2. Moderate stenosis LEFT vertebral artery origin, patent vertebral arteries.  3. RIGHT upper lobe  pneumonia.   CTA HEAD:  1. No emergent large vessel occlusion. No cerebral artery flow-limiting stenosis.  2. Moderate stenosis RIGHT ICA.   Emphysema (ICD10-J43.9).    Aortic Atherosclerosis (ICD10-I70.0).    Ct Head Wo Contrast 03/13/2018 IMPRESSION:  1. No acute abnormality of the head or cervical spine.  2. Chronic small vessel ischemia and cerebral volume loss.    Mr Brain Wo Contrast 03/14/2018 IMPRESSION:  1. Few scattered patchy small volume acute to early subacute ischemic infarcts involving the cortical subcortical right frontal lobe as well as the bilateral cerebellar hemispheres as above. Findings likely embolic in nature. No associated hemorrhage.  2. No other acute intracranial abnormality.  3. Mildly advanced cerebral atrophy with chronic small vessel ischemic disease.  4. Diffuse ventriculomegaly. While this finding may be related to underlying global atrophy, a component of NPH may be contributory, and could be considered in the correct clinical setting.   Vas US Carotid (at [facility]) 03/14/2018 Summary:  Right Carotid: Velocities in the right ICA are consistent with a 1-39% stenosis.  Left Carotid: Velocities in the left ICA are consistent with a 1-39% stenosis.  Vertebrals: Bilateral vertebral arteries demonstrate antegrade flow.  Transthoracic Echocardiogram  03/14/2018 Study Conclusions - Left ventricle: The cavity size was normal. Wall thickness was   normal. Systolic function was normal. The estimated ejection   fraction was in the range of 60% to 65%. Wall motion was normal;   there were no regional wall motion abnormalities. Doppler   parameters are consistent with abnormal left ventricular   relaxation (grade 1 diastolic dysfunction). - Aortic valve: There was no stenosis. There was trivial   regurgitation. - Mitral valve: Moderately calcified annulus. There appears to be   mobile calcification on the lateral annulus. - Left atrium: The  atrium was mildly dilated. - Right ventricle: The cavity size was normal. Systolic function   was normal. - Pulmonary arteries: No complete TR doppler jet so unable to   estimate PA systolic pressure. - Inferior vena cava: The vessel was normal in size. The   respirophasic diameter changes were in the normal range (>= 50%),   consistent with normal central venous pressure. Impressions: - Normal LV size with EF 60-65%. Normal RV size and systolic   function. There is moderate mitral annular calcification with the   appearance of mobile calcification on the lateral annulus. Given   history of CVA, would evaluate this with TEE.  TEE - Left ventricle: Systolic function was normal. The estimated ejection fraction was in the range of 60% to 65%. Wall motion was normal; there were no regional wall motion abnormalities. - Aortic valve: There was trivial regurgitation. - Mitral valve: Severely calcified posterior annulus. Mildly thickened leaflets. - Left atrium: No evidence of thrombus in the atrial cavity or appendage. No evidence of thrombus in the atrial cavity or appendage. No evidence of thrombus in the atrial cavity or appendage. - Right atrium: No evidence of thrombus in the atrial cavity or appendage. - Atrial septum: There was an atrial septal aneurysm.  Impressions:  - Interatrial septum is aneurysmal. There is a small left to right flow in the posterior portion of the septum consistent with a very small septum secundum ASD. Bubble study is positive.   PHYSICAL EXAM  Temp:  [97.3 F (36.3 C)-98.6 F (37 C)] 98 F (36.7 C) (11/04 1300) Pulse Rate:  [75-106] 94 (11/04 1613) Resp:  [16-19] 16 (11/04 1613) BP: (140-200)/(41-111) 151/59 (11/04 1613) SpO2:  [93 %-100 %] 95 % (11/04 1613)  General - Well nourished, well developed, in no apparent distress.  Ophthalmologic - fundi not visualized due to noncooperation.  Cardiovascular - Regular rate and  rhythm.  Mental Status -  Level of arousal and orientation to place was intact, but not orientated to time or situation. Language including expression, repetition, comprehension was assessed and found intact. Naming 3/4  Fund of knowledge impaired.   Cranial Nerves II - XII - II - Visual field intact OU. III, IV, VI - Extraocular movements intact. V - Facial sensation intact bilaterally. VII - Facial movement intact bilaterally. VIII - Hearing & vestibular intact bilaterally. X - Palate elevates symmetrically. XI - Chin turning & shoulder shrug intact bilaterally. XII - Tongue protrusion intact.  Motor Strength - The patient's strength was normal in all extremities and pronator drift was absent.  Bulk was normal and fasciculations were absent.   Motor Tone - Muscle tone was assessed at the neck and appendages and was normal.  Reflexes - The patient's reflexes were symmetrical in all extremities and she had no pathological reflexes.  Sensory - Light touch, temperature/pinprick were assessed and were symmetrical.    Coordination -  The patient had normal movements in the hands with no ataxia or dysmetria.  Tremor was absent.  Gait and Station - deferred.    ASSESSMENT/PLAN [name] is a 83 y.o. female with history of Htn and dementia presenting with AMS, recent fall, lethargy, and leans to the right. She did not receive IV t-PA due to late presentation.  Strokes: bilateral cerebellum and left frontal small and punctate infarcts - embolic pattern -likely due to mobile MV calcification found on TTE  Resultant - disorientation  CT head  - no acute findings.  MRI head - Few scattered patchy small volume acute to early subacute ischemic infarcts involving the cortical subcortical right frontal lobe as well as the bilateral cerebellar hemispheres  CTA H&N - Moderate stenosis Lt VA origin and Rt intracranial ICA.  Carotid Doppler - unremarkable  2D Echo  - EF 60 - 65%.  Mitral annular calcification with possible mobile calcification.   TEE showed severely calcified posterior annulus with mildly thickened leaflets, no mentioning of mobile calcification.  Patient does have a small ASD with ASA.  Positive bubble study.  LE venous Doppler pending  If DVT negative, consider loop recorder placement.  LDL - 129  HgbA1c - 5.0  VTE prophylaxis - Lovenox  Diet -  - Heart healthy with thin liquids.  aspirin 81 mg daily prior to admission, now on aspirin 325 mg daily. Recommend ASA 81 and plavix 75 for 3 weeks and then plavix alone.   Patient counseled to be compliant with her antithrombotic medications  Ongoing aggressive stroke risk factor management  Therapy recommendations:  SNF  Disposition:  Pending  MV calcification  TTE concerning for MV mobile calcification  TEE showed severely calcified MV annulus, no mentioning of mobile calcification  On DAPT now  Hypertension  Stable . Long-term BP goal normotensive  Hyperlipidemia  Lipid lowering medication PTA:  none  LDL 129, goal < 70  Current lipid lowering medication: Lipitor 40 mg daily  Continue statin at discharge  Other Stroke Risk Factors  Advanced age  Former cigarette smoker - quit  Other Active Problems  Leukocytosis WBC 11.4->9.1  Possible NPH by MRI - outpt follow up   Hospital day # 3  Marvel Plan, MD PhD Stroke Neurology 03/17/2018 4:45 PM     To contact Stroke Continuity provider, please refer to [email or web address]. After hours, contact General Neurology

## 2018-03-17 NOTE — CV Procedure (Signed)
     Transesophageal Echocardiogram Note  Nina Bell [id number] [birth year]  Procedure: Transesophageal Echocardiogram Indications: stroke   Procedure Details Consent: Obtained Time Out: Verified patient identification, verified procedure, site/side was marked, verified correct patient position, special equipment/implants available, Radiology Safety Procedures followed,  medications/allergies/relevent history reviewed, required imaging and test results available.  Performed  Medications: During this procedure the patient is administered IV propofol administered by anesthesia staff to achieve and maintain moderate conscious sedation.  The patient's heart rate, blood pressure, and oxygen saturation are monitored continuously during the procedure. The period of conscious sedation is 30 minutes, of which I was present face-to-face 100% of this time.  - Left ventricle: Systolic function was normal. The estimated   ejection fraction was in the range of 60% to 65%. Wall motion was   normal; there were no regional wall motion abnormalities. - Aortic valve: There was trivial regurgitation. - Mitral valve: Severely calcified posterior annulus. Mildly   thickened leaflets. - Left atrium: No evidence of thrombus in the atrial cavity or   appendage. No evidence of thrombus in the atrial cavity or   appendage. No evidence of thrombus in the atrial cavity or   appendage. - Right atrium: No evidence of thrombus in the atrial cavity or   appendage. - Atrial septum: There was an atrial septal aneurysm.  Impressions:  - Interatrial septum is aneurysmal. There is a small left to right   flow in the posterior portion of the septum consistent with a   very small septum secundum ASD. Bubble study is positive.   Complications: No apparent complications Patient did tolerate procedure well.  Tobias Alexander, MD, [facility] 03/17/2018, 2:24 PM

## 2018-03-17 NOTE — Anesthesia Preprocedure Evaluation (Addendum)
Anesthesia Evaluation  Patient identified by MRN, date of birth, ID band Patient awake and Patient confused    Reviewed: Allergy & Precautions, NPO status , Patient's Chart, lab work & pertinent test results, reviewed documented beta blocker date and time   Airway Mallampati: III  TM Distance: <3 FB Neck ROM: Full  Mouth opening: Limited Mouth Opening  Dental  (+) Teeth Intact   Pulmonary former smoker,    breath sounds clear to auscultation       Cardiovascular hypertension, Pt. on home beta blockers and Pt. on medications  Rhythm:Regular Rate:Normal     Neuro/Psych PSYCHIATRIC DISORDERS Dementia CVA    GI/Hepatic negative GI ROS, Neg liver ROS,   Endo/Other  negative endocrine ROS  Renal/GU negative Renal ROS     Musculoskeletal  (+) Arthritis ,   Abdominal   Peds  Hematology negative hematology ROS (+)   Anesthesia Other Findings   Reproductive/Obstetrics                            Anesthesia Physical Anesthesia Plan  ASA: III  Anesthesia Plan: MAC   Post-op Pain Management:    Induction:   PONV Risk Score and Plan: 2 and Treatment may vary due to age or medical condition and Propofol infusion  Airway Management Planned: Natural Airway and Nasal Cannula  Additional Equipment:   Intra-op Plan:   Post-operative Plan:   Informed Consent: I have reviewed the patients History and Physical, chart, labs and discussed the procedure including the risks, benefits and alternatives for the proposed anesthesia with the patient or authorized representative who has indicated his/her understanding and acceptance.     Plan Discussed with: CRNA  Anesthesia Plan Comments: (Spoke with patient and patient's daughter regarding DNR status. Pt wishes to be "full code" during periprocedural period.)      Anesthesia Quick Evaluation

## 2018-03-17 NOTE — Progress Notes (Signed)
CSW met with patient and daughter at bedside. Patient's daughter discussed how she didn't quite think that the patient was at a point where she'd do well going back to her ALF, wants to pursue SNF. Patient has been at SNF in the past and done well. Patient's family lives near [place], would prefer [facility]. CSW sent referral, and [facility] is able to accept.  CSW to follow when patient is medically stable to transition to SNF.  Laveda Abbe, [place] Clinical Social Worker [phone]

## 2018-03-17 NOTE — Care Management Important Message (Signed)
Important Message  Patient Details  Name: Nina Bell MRN: [id number] Date of Birth: Sep 07, 1934   Medicare Important Message Given:  Yes    Florence Antonelli Stefan Church 03/17/2018, 3:45 PM

## 2018-03-17 NOTE — Progress Notes (Signed)
  Echocardiogram Echocardiogram Transesophageal has been performed.  Delcie Roch 03/17/2018, 1:05 PM

## 2018-03-17 NOTE — Progress Notes (Signed)
Occupational Therapy Treatment Patient Details Name: Nina Bell MRN: [id number] DOB: 14-Feb-1935 Today's Date: 03/17/2018    History of present illness 83 y.o. female with medical history significant for Dementia, HTN, and arthritis who presents to the ED with change in mental status over the last 2 days.Patient reportedly had a witnessed fall in facility the day prior to admission without injury.  Since then the daughter and facility staff noticed that she had a notable change in her mental status and functional status.    OT comments  Patient supine in bed and willing to participate in OT session.  Requires increased time and effort for all activities, cueing for sequencing and problem solving throughout session.  Pt continues to be oriented to self only, reoriented during session.  Patient able to complete oral care and washing face/hands with min assist seated EOB, cueing for sequencing.  Max assist to ascend from EOB (requires 2 attempts), as patient is hesitant and appears highly fearful of falling.  DC plan remains appropriate.  Will continue to follow.    Follow Up Recommendations  Home health OT;Supervision/Assistance - 24 hour    Equipment Recommendations  None recommended by OT    Recommendations for Other Services PT consult;Speech consult    Precautions / Restrictions Precautions Precautions: Fall Restrictions Weight Bearing Restrictions: No       Mobility Bed Mobility Overal bed mobility: Needs Assistance Bed Mobility: Supine to Sit;Sit to Supine     Supine to sit: Min assist;HOB elevated Sit to supine: Supervision   General bed mobility comments: pt requires min assist for trunk to EOB x 2, as returned to supine without assist after grooming tasks; cueing for sequencing   Transfers Overall transfer level: Needs assistance Equipment used: Rolling walker (2 wheeled) Transfers: Sit to/from [employer] Sit to Stand: Max assist Stand pivot  transfers: Max assist       General transfer comment: Requires increased time and effort, cueing for sequencing and safety, pt appears highly fearful of falling, max assist to power up into standing but able to pivot with RW to recliner with min assist     Balance Overall balance assessment: Needs assistance;History of Falls Sitting-balance support: Feet supported;No upper extremity supported Sitting balance-Leahy Scale: Fair     Standing balance support: Bilateral upper extremity supported;During functional activity Standing balance-Leahy Scale: Poor Standing balance comment: reliant on BUE support                           ADL either performed or assessed with clinical judgement   ADL Overall ADL's : Needs assistance/impaired     Grooming: Wash/dry hands;Wash/dry face;Oral care;Minimal assistance Grooming Details (indicate cue type and reason): oral care with mouth wash only, cueing for sequencing                  Toilet Transfer: Maximal assistance;Stand-pivot;RW(simulated to recliner ) Toilet Transfer Details (indicate cue type and reason): cueing for hand placement, safety, max assist to ascend then min assist for stand pivot            General ADL Comments: completed bed mobility, ADLs at EOB, and SPT to recliner      Vision       Perception     Praxis      Cognition Arousal/Alertness: Awake/alert Behavior During Therapy: Flat affect Overall Cognitive Status: Impaired/Different from baseline Area of Impairment: Awareness;Safety/judgement;Following commands;Memory;Attention;Orientation;Problem solving  Orientation Level: Disoriented to;Time;Situation;Place Current Attention Level: Focused Memory: Decreased recall of precautions;Decreased short-term memory Following Commands: Follows one step commands consistently;Follows one step commands with increased time;Follows multi-step commands inconsistently Safety/Judgement:  Decreased awareness of safety;Decreased awareness of deficits Awareness: Intellectual Problem Solving: Slow processing;Decreased initiation;Difficulty sequencing;Requires verbal cues          Exercises     Shoulder Instructions       General Comments daughter present     Pertinent Vitals/ Pain       Pain Assessment: No/denies pain  Home Living                                          Prior Functioning/Environment              Frequency  Min 2X/week        Progress Toward Goals  OT Goals(current goals can now be found in the care plan section)  Progress towards OT goals: Progressing toward goals  Acute Rehab OT Goals Patient Stated Goal: daughter: to return to [facility], pt seems agreeable to this.  OT Goal Formulation: With patient/family Time For Goal Achievement: 03/28/18 Potential to Achieve Goals: Good  Plan Discharge plan remains appropriate;Frequency remains appropriate    Co-evaluation                 AM-PAC PT "6 Clicks" Daily Activity     Outcome Measure   Help from another person eating meals?: A Lot(clinical judgement, pt is NPO) Help from another person taking care of personal grooming?: A Little Help from another person toileting, which includes using toliet, bedpan, or urinal?: A Lot Help from another person bathing (including washing, rinsing, drying)?: A Lot Help from another person to put on and taking off regular upper body clothing?: A Lot Help from another person to put on and taking off regular lower body clothing?: A Lot 6 Click Score: 13    End of Session Equipment Utilized During Treatment: Gait belt;Rolling walker  OT Visit Diagnosis: Unsteadiness on feet (R26.81);Muscle weakness (generalized) (M62.81);Other abnormalities of gait and mobility (R26.89);History of falling (Z91.81);Other symptoms and signs involving cognitive function   Activity Tolerance Patient tolerated treatment well   Patient  Left in chair;with call bell/phone within reach;with chair alarm set;with family/visitor present   Nurse Communication Mobility status        Time: 4098-1191 OT Time Calculation (min): 29 min  Charges: OT General Charges $OT Visit: 1 Visit OT Treatments $Self Care/Home Management : 23-37 mins  Chancy Milroy, OT Acute Rehabilitation Services Pager [phone] Office [phone]    Chancy Milroy 03/17/2018, 11:02 AM

## 2018-03-17 NOTE — Progress Notes (Signed)
Left message with [facility] message notifying staff of pending discharge, more than likely test will be done tomorrow, 03/18/18

## 2018-03-18 ENCOUNTER — Other Ambulatory Visit: Payer: Self-pay | Admitting: Nurse Practitioner

## 2018-03-18 ENCOUNTER — Inpatient Hospital Stay (HOSPITAL_COMMUNITY): Payer: Medicare Other

## 2018-03-18 DIAGNOSIS — F0280 Dementia in other diseases classified elsewhere without behavioral disturbance: Secondary | ICD-10-CM

## 2018-03-18 DIAGNOSIS — G309 Alzheimer's disease, unspecified: Secondary | ICD-10-CM

## 2018-03-18 DIAGNOSIS — M7989 Other specified soft tissue disorders: Secondary | ICD-10-CM

## 2018-03-18 DIAGNOSIS — I639 Cerebral infarction, unspecified: Secondary | ICD-10-CM

## 2018-03-18 MED ORDER — ATORVASTATIN CALCIUM 40 MG PO TABS: 40.0000 mg | ORAL_TABLET | Freq: Every day | ORAL | Status: AC

## 2018-03-18 MED ORDER — CLOPIDOGREL BISULFATE 75 MG PO TABS: 75.0000 mg | ORAL_TABLET | Freq: Every day | ORAL | Status: AC

## 2018-03-18 NOTE — Progress Notes (Signed)
CSW contacted [facility] to check auth status. Auth has been received, but not assigned. CSW spoke with coordinator who said it should be assigned shortly. CSW informed coordinator that the patient was ready for discharge, just awaiting auth approval.  CSW to follow.  Blenda Nicely, [place] Clinical Social Worker [phone]

## 2018-03-18 NOTE — Progress Notes (Signed)
*  Preliminary Results* Bilateral lower extremity venous duplex completed. There is no obvious evidence of bilateral lower extremity deep vein thrombosis bilaterally.  03/18/2018 9:36 AM Gertie Fey, MHA, RVT, RDCS, RDMS

## 2018-03-18 NOTE — Progress Notes (Signed)
Physical Therapy Treatment Patient Details Name: Nina Bell MRN: [id number] DOB: 1934/10/10 Today's Date: 03/18/2018    History of Present Illness 83 y.o. female with medical history significant for Dementia, HTN, and arthritis who presents to the ED with change in mental status over the last 2 days.Patient reportedly had a witnessed fall in facility the day prior to admission without injury.  Since then the daughter and facility staff noticed that she had a notable change in her mental status and functional status.     PT Comments    Pt very fatigued today with neck and head pain. Attempted standing EOB but pt unable to reach full upright position. Stedy used for sit to stand with +2 max A and turn to chair. Pt maintained partial stand for ~2 mins for bowel clean up before sitting again. Minimal verbalization throughout session today and requesting pain meds end of session for neck pain. RN notified. PT will continue to follow.    Follow Up Recommendations  SNF;Supervision/Assistance - 24 hour     Equipment Recommendations  None recommended by PT    Recommendations for Other Services       Precautions / Restrictions Precautions Precautions: Fall Restrictions Weight Bearing Restrictions: No Other Position/Activity Restrictions: Stage II pressure ulcer left buttock    Mobility  Bed Mobility Overal bed mobility: Needs Assistance Bed Mobility: Supine to Sit     Supine to sit: Min assist;HOB elevated     General bed mobility comments: needs increased time for all mobility, needed min A to initiate supine to sit as well as occasional cue to keep moving and to get to trunk to fully erect position  Transfers Overall transfer level: Needs assistance Equipment used: Ambulation equipment used Transfers: Sit to/from [employer] Transfers Sit to Stand: Max assist;+2 physical assistance Stand pivot transfers: Max assist       General transfer comment: Attempted sit to  stand with therapist in front of pt and pt could barely clear buttocks from bed and could not step feet to turn to chair or maintain standing position, stated that she needed to sit back down. Stedy used next and pt was able to achieve full standing with max A +2. Pivoted to chair in stedy and then returned to standing for bowel clean up but pt unable to maintain upright, leaned over front bar x1 min  Ambulation/Gait             General Gait Details: unable - hasnt walked in over a year   Stairs             Wheelchair Mobility    Modified Rankin (Stroke Patients Only)       Balance Overall balance assessment: Needs assistance;History of Falls Sitting-balance support: Feet supported;No upper extremity supported Sitting balance-Leahy Scale: Fair     Standing balance support: Bilateral upper extremity supported;During functional activity Standing balance-Leahy Scale: Zero Standing balance comment: reliant on BUE support and external assist                            Cognition Arousal/Alertness: Awake/alert Behavior During Therapy: Flat affect Overall Cognitive Status: Impaired/Different from baseline Area of Impairment: Awareness;Safety/judgement;Following commands;Memory;Attention;Orientation;Problem solving                 Orientation Level: Disoriented to;Time;Situation;Place Current Attention Level: Sustained Memory: Decreased recall of precautions;Decreased short-term memory Following Commands: Follows one step commands consistently;Follows one step commands with increased time;Follows multi-step commands  inconsistently Safety/Judgement: Decreased awareness of safety;Decreased awareness of deficits Awareness: Intellectual Problem Solving: Slow processing;Decreased initiation;Difficulty sequencing;Requires verbal cues General Comments: pt with little verbalization throughout session      Exercises General Exercises - Lower Extremity Ankle  Circles/Pumps: AROM;Both;10 reps;Seated Quad Sets: AROM;Both;10 reps;Seated Heel Slides: AROM;Both;10 reps;Seated Hip ABduction/ADduction: AAROM;Both;10 reps;Seated    General Comments General comments (skin integrity, edema, etc.): R lean noted when sitting in stedy and on EOB      Pertinent Vitals/Pain Pain Assessment: Faces Faces Pain Scale: Hurts little more Pain Location: neck, head Pain Descriptors / Indicators: Aching Pain Intervention(s): Monitored during session;Patient requesting pain meds-RN notified    Home Living                      Prior Function            PT Goals (current goals can now be found in the care plan section) Acute Rehab PT Goals Patient Stated Goal: none stated PT Goal Formulation: Patient unable to participate in goal setting Time For Goal Achievement: 03/28/18 Potential to Achieve Goals: Good Progress towards PT goals: Not progressing toward goals - comment(weakness/ fatigue)    Frequency    Min 2X/week      PT Plan Current plan remains appropriate    Co-evaluation              AM-PAC PT "6 Clicks" Daily Activity  Outcome Measure  Difficulty turning over in bed (including adjusting bedclothes, sheets and blankets)?: Unable Difficulty moving from lying on back to sitting on the side of the bed? : Unable Difficulty sitting down on and standing up from a chair with arms (e.g., wheelchair, bedside commode, etc,.)?: Unable Help needed moving to and from a bed to chair (including a wheelchair)?: Total Help needed walking in hospital room?: Total Help needed climbing 3-5 steps with a railing? : Total 6 Click Score: 6    End of Session Equipment Utilized During Treatment: Gait belt Activity Tolerance: Patient limited by fatigue Patient left: in chair;with call bell/phone within reach;with chair alarm set Nurse Communication: Mobility status PT Visit Diagnosis: Unsteadiness on feet (R26.81);Muscle weakness (generalized)  (M62.81)     Time: 0981-1914 PT Time Calculation (min) (ACUTE ONLY): 26 min  Charges:  $Therapeutic Exercise: 8-22 mins $Therapeutic Activity: 8-22 mins                     Lyanne Co, PT  Acute Rehab Services  Pager [phone] Office [phone]    Nina Bell 03/18/2018, 3:11 PM

## 2018-03-18 NOTE — Progress Notes (Signed)
CSW faxed request to [facility] for patient to discharge to SNF. CSW to follow.  Blenda Nicely, [place] Clinical Social Worker [phone]

## 2018-03-18 NOTE — Consult Note (Addendum)
ELECTROPHYSIOLOGY CONSULT NOTE  Patient ID: Nina Bell MRN: [id number], DOB/AGE: 10/04/1934   Admit date: 03/13/2018 Date of Consult: 03/18/2018  Primary Physician: Florentina Jenny, MD Primary Cardiologist: new to [facility] Reason for Consultation: Cryptogenic stroke; recommendations regarding Implantable Loop Recorder  History of Present Illness EP has been asked to evaluate Nina Bell for placement of an implantable loop recorder to monitor for atrial fibrillation by Dr Roda Shutters.  The patient was admitted on 03/13/2018 with recent fall, altered mental status.  They first developed symptoms while at SNF and her daughter noticed that she was not at baseline.  Imaging demonstrated bilateral cerebellum and left frontal small and punctate infarcts felt to be embolic.  She has undergone workup for stroke including echocardiogram and carotid dopplers.  The patient has been monitored on telemetry which has demonstrated sinus rhythm with short runs of PAT. Echocardiogram demonstrated EF 60-65%, grade 1 diastolic dysfunction, mobile calcification on mitral valve, LA 40. TEE demonstrated severely calcified mitral valve annulus, small ASD.  LE venous dopplers were negative for DVT.   The patient has baseline dementia but denies chest pain, shortness of breath, dizziness, palpitations, or syncope.  She does report intermittent falls and states they are from "when she tries to do gymnastics".    Past Medical History:  Diagnosis Date  . Arthritis   . Dementia (HCC)   . Hypertension      Surgical History:  Past Surgical History:  Procedure Laterality Date  . CHOLECYSTECTOMY    . TEE WITHOUT CARDIOVERSION N/A 03/17/2018   Procedure: TRANSESOPHAGEAL ECHOCARDIOGRAM (TEE);  Surgeon: Lars Masson, MD;  Location: [facility] ENDOSCOPY;  Service: Cardiovascular;  Laterality: N/A;     Medications Prior to Admission  Medication Sig Dispense Refill Last Dose  . acetaminophen (TYLENOL) 325 MG tablet Take  650 mg by mouth daily as needed for mild pain.   03/10/2018 at Unknown time  . aspirin 81 MG chewable tablet Chew 81 mg by mouth daily.   03/13/2018 at Unknown time  . atenolol (TENORMIN) 50 MG tablet Take 50 mg by mouth daily.   03/13/2018 at 0900  . losartan (COZAAR) 50 MG tablet Take 50 mg by mouth daily.   03/13/2018 at Unknown time  . Multiple Vitamin (MULTIVITAMIN WITH MINERALS) TABS tablet Take 1 tablet by mouth daily.   03/13/2018 at Unknown time  . psyllium (REGULOID) 0.52 g capsule Take 0.52 g by mouth at bedtime.   03/12/2018 at Unknown time    Inpatient Medications:  . aspirin EC  81 mg Oral Daily  . atenolol  50 mg Oral Daily  . atorvastatin  40 mg Oral q1800  . clopidogrel  75 mg Oral Daily  . enoxaparin (LOVENOX) injection  40 mg Subcutaneous Q24H  . losartan  50 mg Oral Daily  . multivitamin with minerals  1 tablet Oral Daily  . psyllium  1 packet Oral QHS  . sodium chloride flush  3 mL Intravenous Q12H    Allergies:  Allergies  Allergen Reactions  . Penicillins Other (See Comments)    unknown    Social History   Socioeconomic History  . Marital status: Single    Spouse name: Not on file  . Number of children: Not on file  . Years of education: Not on file  . Highest education level: Not on file  Occupational History  . Not on file  Social Needs  . Financial resource strain: Not on file  . Food insecurity:    Worry: Not  on file    Inability: Not on file  . Transportation needs:    Medical: Not on file    Non-medical: Not on file  Tobacco Use  . Smoking status: Former Smoker    Last attempt to quit: 03/13/1989    Years since quitting: 29.0  . Smokeless tobacco: Never Used  Substance and Sexual Activity  . Alcohol use: Never    Frequency: Never  . Drug use: Never  . Sexual activity: Not on file  Lifestyle  . Physical activity:    Days per week: Not on file    Minutes per session: Not on file  . Stress: Not on file  Relationships  . Social  connections:    Talks on phone: Not on file    Gets together: Not on file    Attends religious service: Not on file    Active member of club or organization: Not on file    Attends meetings of clubs or organizations: Not on file    Relationship status: Not on file  . Intimate partner violence:    Fear of current or ex partner: Not on file    Emotionally abused: Not on file    Physically abused: Not on file    Forced sexual activity: Not on file  Other Topics Concern  . Not on file  Social History Narrative  . Not on file     Family History  Problem Relation Age of Onset  . Stomach cancer Mother   . Hypertension Mother       Review of Systems: All other systems reviewed and are otherwise negative except as noted above.  Physical Exam: Vitals:   03/17/18 2354 03/18/18 0317 03/18/18 0801 03/18/18 1149  BP: 136/63 (!) 149/52 (!) 156/64 (!) 156/57  Pulse: 70 64 67 75  Resp: 18 18 18 16   Temp: 98.5 F (36.9 C) 97.8 F (36.6 C) (!) 97.4 F (36.3 C) 97.6 F (36.4 C)  TempSrc: Oral Oral Oral Oral  SpO2: 94% 94% 95% 94%  Weight:      Height:        GEN- The patient is elderly appearing, pleasantly confused  Head- normocephalic, atraumatic Eyes-  Sclera clear, conjunctiva pink Ears- hearing intact Oropharynx- clear Neck- supple Lungs- Clear to ausculation bilaterally, normal work of breathing Heart- Regular rate and rhythm  GI- soft, NT, ND, + BS Extremities- no clubbing, cyanosis, or edema MS- no significant deformity or atrophy Skin- no rash or lesion Psych- euthymic mood, full affect   Labs:   Lab Results  Component Value Date   WBC 9.1 03/14/2018   HGB 11.4 (L) 03/14/2018   HCT 34.3 (L) 03/14/2018   MCV 94.0 03/14/2018   PLT 223 03/14/2018    Recent Labs  Lab 03/13/18 1806 03/14/18 0531  NA 138 139  K 4.4 3.7  CL 106 110  CO2 23 24  BUN 19 12  CREATININE 0.98 0.76  CALCIUM 9.1 8.3*  PROT 7.0  --   BILITOT 0.5  --   ALKPHOS 65  --   ALT 18   --   AST 19  --   GLUCOSE 109* 96     Radiology/Studies: Ct Angio Head W Or Wo Contrast  Result Date: 03/15/2018 CLINICAL DATA:  Stroke follow-up. History of hypertension and dementia. EXAM: CT ANGIOGRAPHY HEAD AND NECK TECHNIQUE: Multidetector CT imaging of the head and neck was performed using the standard protocol during bolus administration of intravenous contrast. Multiplanar CT image reconstructions  and MIPs were obtained to evaluate the vascular anatomy. Carotid stenosis measurements (when applicable) are obtained utilizing NASCET criteria, using the distal internal carotid diameter as the denominator. CONTRAST:  50mL ISOVUE-370 IOPAMIDOL (ISOVUE-370) INJECTION 76% COMPARISON:  CT HEAD March 13, 2018 and MRI head March 14, 2018 FINDINGS: CT HEAD FINDINGS BRAIN: No intraparenchymal hemorrhage, mass effect nor midline shift. Moderate to severe ventriculomegaly with disproportionate mesial temporal lobe atrophy and ex vacuo dilatation temporal horns. Patchy supratentorial white matter hypodensities compatible with moderate chronic small vessel ischemic changes, unchanged. No acute large vascular territory infarcts. No abnormal extra-axial fluid collections. Basal cisterns are patent. VASCULAR: Moderate calcific atherosclerosis of the carotid siphons. SKULL: No skull fracture. No significant scalp soft tissue swelling. SINUSES/ORBITS: Small LEFT maxillary sinus mucosal retention cyst. Mastoid air cells are well aerated.The included ocular globes and orbital contents are non-suspicious. Status post bilateral ocular lens implants. OTHER: None. CTA NECK FINDINGS: AORTIC ARCH: Normal appearance of the thoracic arch, normal branch pattern. Moderate calcific atherosclerosis aortic arch the origins of the innominate, left Common carotid artery and subclavian artery are patent. Mild stenosis LEFT common carotid artery and LEFT subclavian artery origins. RIGHT CAROTID SYSTEM: Common carotid artery is patent.  Mild calcific atherosclerosis of the carotid bifurcation without hemodynamically significant stenosis by NASCET criteria. Normal appearance of the internal carotid artery. LEFT CAROTID SYSTEM: Common carotid artery is patent. Mild calcific atherosclerosis of the carotid bifurcation without hemodynamically significant stenosis by NASCET criteria. Normal appearance of the internal carotid artery. VERTEBRAL ARTERIES:Patent codominant vertebral arteries. Moderate stenosis LEFT vertebral artery origin. SKELETON: No acute osseous process though bone windows have not been submitted. Old manubrial fracture. OTHER NECK: Soft tissues of the neck are nonacute though, not tailored for evaluation. 11 mm RIGHT thyroid nodule, no routine indicated follow-up by consensus. UPPER CHEST: Patchy RIGHT upper lobe subpleural consolidation. Mild centrilobular emphysema. No superior mediastinal lymphadenopathy. CTA HEAD FINDINGS: ANTERIOR CIRCULATION: Patent cervical internal carotid arteries, petrous, cavernous and supra clinoid internal carotid arteries. Moderate stenosis RIGHT cavernous ICA. 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 Patent anterior communicating artery. Patent anterior and middle cerebral arteries. No large vessel occlusion, significant stenosis, contrast extravasation. POSTERIOR CIRCULATION: Patent vertebral arteries, vertebrobasilar junction and basilar artery, as well as main branch vessels. Patent posterior cerebral arteries, mild luminal irregularity compatible with atherosclerosis. No large vessel occlusion, significant stenosis, contrast extravasation or aneurysm. VENOUS SINUSES: Major dural venous sinuses are patent though not tailored for evaluation on this angiographic examination. ANATOMIC VARIANTS: None. DELAYED PHASE: Moderately motion degraded series, no definite abnormal intracranial enhancement. MIP images reviewed. IMPRESSION: CT HEAD: 1. No acute intracranial process; no acute infarcts not apparent  by CT. 2. Stable examination including disproportionate mesial temporal lobe atrophy associated with neuro degenerative syndromes. CTA NECK: 1. No hemodynamically significant stenosis ICA's. 2. Moderate stenosis LEFT vertebral artery origin, patent vertebral arteries. 3. RIGHT upper lobe pneumonia. CTA HEAD: 1. No emergent large vessel occlusion. No cerebral artery flow-limiting stenosis. 2. Moderate stenosis RIGHT ICA. Emphysema (ICD10-J43.9).  Aortic Atherosclerosis (ICD10-I70.0). Electronically Signed   By: Awilda Metro M.D.   On: 03/15/2018 00:15   Dg Chest 1 View  Result Date: 03/13/2018 CLINICAL DATA:  Altered mental status, fell yesterday, disorientation EXAM: CHEST  1 VIEW COMPARISON:  None. FINDINGS: No active infiltrate or effusion is seen. Mediastinal and hilar contours are unremarkable. The heart is mildly enlarged. No bony abnormality is seen. A small hiatal hernia cannot be excluded. IMPRESSION: 1. No active lung disease. 2. Borderline cardiomegaly.  Electronically Signed   By: Dwyane Dee M.D.   On: 03/13/2018 17:04   Ct Head Wo Contrast  Result Date: 03/13/2018 CLINICAL DATA:  Fall EXAM: CT HEAD WITHOUT CONTRAST CT CERVICAL SPINE WITHOUT CONTRAST TECHNIQUE: Multidetector CT imaging of the head and cervical spine was performed following the standard protocol without intravenous contrast. Multiplanar CT image reconstructions of the cervical spine were also generated. COMPARISON:  None. FINDINGS: CT HEAD FINDINGS Brain: There is no mass, hemorrhage or extra-axial collection. There is generalized atrophy without lobar predilection. There is hypoattenuation of the periventricular white matter, most commonly indicating chronic ischemic microangiopathy. Vascular: Atherosclerotic calcification of the internal carotid arteries at the skull base. No abnormal hyperdensity of the major intracranial arteries or dural venous sinuses. Skull: The visualized skull base, calvarium and extracranial soft  tissues are normal. Sinuses/Orbits: No fluid levels or advanced mucosal thickening of the visualized paranasal sinuses. No mastoid or middle ear effusion. The orbits are normal. CT CERVICAL SPINE FINDINGS Alignment: No static subluxation. Facets are aligned. Occipital condyles are normally positioned. Skull base and vertebrae: No acute fracture. Soft tissues and spinal canal: No prevertebral fluid or swelling. No visible canal hematoma. Disc levels: No advanced spinal canal or neural foraminal stenosis. Upper chest: No pneumothorax, pulmonary nodule or pleural effusion. Other: Normal visualized paraspinal cervical soft tissues. IMPRESSION: 1. No acute abnormality of the head or cervical spine. 2. Chronic small vessel ischemia and cerebral volume loss. Electronically Signed   By: Deatra Robinson M.D.   On: 03/13/2018 17:58   Ct Angio Neck W Or Wo Contrast  Result Date: 03/15/2018 CLINICAL DATA:  Stroke follow-up. History of hypertension and dementia. EXAM: CT ANGIOGRAPHY HEAD AND NECK TECHNIQUE: Multidetector CT imaging of the head and neck was performed using the standard protocol during bolus administration of intravenous contrast. Multiplanar CT image reconstructions and MIPs were obtained to evaluate the vascular anatomy. Carotid stenosis measurements (when applicable) are obtained utilizing NASCET criteria, using the distal internal carotid diameter as the denominator. CONTRAST:  50mL ISOVUE-370 IOPAMIDOL (ISOVUE-370) INJECTION 76% COMPARISON:  CT HEAD March 13, 2018 and MRI head March 14, 2018 FINDINGS: CT HEAD FINDINGS BRAIN: No intraparenchymal hemorrhage, mass effect nor midline shift. Moderate to severe ventriculomegaly with disproportionate mesial temporal lobe atrophy and ex vacuo dilatation temporal horns. Patchy supratentorial white matter hypodensities compatible with moderate chronic small vessel ischemic changes, unchanged. No acute large vascular territory infarcts. No abnormal extra-axial  fluid collections. Basal cisterns are patent. VASCULAR: Moderate calcific atherosclerosis of the carotid siphons. SKULL: No skull fracture. No significant scalp soft tissue swelling. SINUSES/ORBITS: Small LEFT maxillary sinus mucosal retention cyst. Mastoid air cells are well aerated.The included ocular globes and orbital contents are non-suspicious. Status post bilateral ocular lens implants. OTHER: None. CTA NECK FINDINGS: AORTIC ARCH: Normal appearance of the thoracic arch, normal branch pattern. Moderate calcific atherosclerosis aortic arch the origins of the innominate, left Common carotid artery and subclavian artery are patent. Mild stenosis LEFT common carotid artery and LEFT subclavian artery origins. RIGHT CAROTID SYSTEM: Common carotid artery is patent. Mild calcific atherosclerosis of the carotid bifurcation without hemodynamically significant stenosis by NASCET criteria. Normal appearance of the internal carotid artery. LEFT CAROTID SYSTEM: Common carotid artery is patent. Mild calcific atherosclerosis of the carotid bifurcation without hemodynamically significant stenosis by NASCET criteria. Normal appearance of the internal carotid artery. VERTEBRAL ARTERIES:Patent codominant vertebral arteries. Moderate stenosis LEFT vertebral artery origin. SKELETON: No acute osseous process though bone windows have not been submitted. Old manubrial fracture.  OTHER NECK: Soft tissues of the neck are nonacute though, not tailored for evaluation. 11 mm RIGHT thyroid nodule, no routine indicated follow-up by consensus. UPPER CHEST: Patchy RIGHT upper lobe subpleural consolidation. Mild centrilobular emphysema. No superior mediastinal lymphadenopathy. CTA HEAD FINDINGS: ANTERIOR CIRCULATION: Patent cervical internal carotid arteries, petrous, cavernous and supra clinoid internal carotid arteries. Moderate stenosis RIGHT cavernous ICA. 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 Patent anterior communicating artery.  Patent anterior and middle cerebral arteries. No large vessel occlusion, significant stenosis, contrast extravasation. POSTERIOR CIRCULATION: Patent vertebral arteries, vertebrobasilar junction and basilar artery, as well as main branch vessels. Patent posterior cerebral arteries, mild luminal irregularity compatible with atherosclerosis. No large vessel occlusion, significant stenosis, contrast extravasation or aneurysm. VENOUS SINUSES: Major dural venous sinuses are patent though not tailored for evaluation on this angiographic examination. ANATOMIC VARIANTS: None. DELAYED PHASE: Moderately motion degraded series, no definite abnormal intracranial enhancement. MIP images reviewed. IMPRESSION: CT HEAD: 1. No acute intracranial process; no acute infarcts not apparent by CT. 2. Stable examination including disproportionate mesial temporal lobe atrophy associated with neuro degenerative syndromes. CTA NECK: 1. No hemodynamically significant stenosis ICA's. 2. Moderate stenosis LEFT vertebral artery origin, patent vertebral arteries. 3. RIGHT upper lobe pneumonia. CTA HEAD: 1. No emergent large vessel occlusion. No cerebral artery flow-limiting stenosis. 2. Moderate stenosis RIGHT ICA. Emphysema (ICD10-J43.9).  Aortic Atherosclerosis (ICD10-I70.0). Electronically Signed   By: Awilda Metro M.D.   On: 03/15/2018 00:15   Ct Cervical Spine Wo Contrast  Result Date: 03/13/2018 CLINICAL DATA:  Fall EXAM: CT HEAD WITHOUT CONTRAST CT CERVICAL SPINE WITHOUT CONTRAST TECHNIQUE: Multidetector CT imaging of the head and cervical spine was performed following the standard protocol without intravenous contrast. Multiplanar CT image reconstructions of the cervical spine were also generated. COMPARISON:  None. FINDINGS: CT HEAD FINDINGS Brain: There is no mass, hemorrhage or extra-axial collection. There is generalized atrophy without lobar predilection. There is hypoattenuation of the periventricular white matter, most  commonly indicating chronic ischemic microangiopathy. Vascular: Atherosclerotic calcification of the internal carotid arteries at the skull base. No abnormal hyperdensity of the major intracranial arteries or dural venous sinuses. Skull: The visualized skull base, calvarium and extracranial soft tissues are normal. Sinuses/Orbits: No fluid levels or advanced mucosal thickening of the visualized paranasal sinuses. No mastoid or middle ear effusion. The orbits are normal. CT CERVICAL SPINE FINDINGS Alignment: No static subluxation. Facets are aligned. Occipital condyles are normally positioned. Skull base and vertebrae: No acute fracture. Soft tissues and spinal canal: No prevertebral fluid or swelling. No visible canal hematoma. Disc levels: No advanced spinal canal or neural foraminal stenosis. Upper chest: No pneumothorax, pulmonary nodule or pleural effusion. Other: Normal visualized paraspinal cervical soft tissues. IMPRESSION: 1. No acute abnormality of the head or cervical spine. 2. Chronic small vessel ischemia and cerebral volume loss. Electronically Signed   By: Deatra Robinson M.D.   On: 03/13/2018 17:58   Mr Brain Wo Contrast  Result Date: 03/14/2018 CLINICAL DATA:  Initial evaluation for acute altered mental status. The EXAM: MRI HEAD WITHOUT CONTRAST TECHNIQUE: Multiplanar, multiecho pulse sequences of the brain and surrounding structures were obtained without intravenous contrast. COMPARISON:  Prior CT from 03/13/2018. FINDINGS: Brain: Examination mildly degraded by motion artifact. Diffuse prominence of the CSF containing spaces compatible with generalized cerebral atrophy, mildly advanced in nature. Confluent T2/FLAIR hyperintensity within the periventricular white matter most consistent with chronic small vessel ischemic disease, mild for age. There is minimal patchy diffusion abnormality involving the cortical and subcortical  white matter within the right frontal lobe (series 5, image 81),  consistent with small acute right MCA territory infarcts. A few additional punctate foci of diffusion abnormality within the bilateral cerebellar hemispheres (series 5, image 54, 53, 46), also consistent with acute/early subacute ischemic infarcts. Findings are likely embolic in nature. No associated hemorrhage or mass effect. No other evidence for acute or subacute ischemia. Gray-white matter differentiation otherwise maintained. No acute or chronic intracranial hemorrhage. No mass lesion, midline shift or mass effect. Diffuse ventricular prominence most like related global parenchymal volume loss. A degree of underlying NPH would be difficult to exclude. No extra-axial fluid collection. Pituitary gland within normal limits. Vascular: Major intracranial vascular flow voids maintained. Skull and upper cervical spine: Craniocervical junction normal. Upper cervical spine within normal limits. Bone marrow signal intensity normal. No scalp soft tissue abnormality. Sinuses/Orbits: Patient status post ocular lens replacement bilaterally. Paranasal sinuses are clear. Trace opacity left mastoid air cells, of doubtful significance. Other: End. IMPRESSION: 1. Few scattered patchy small volume acute to early subacute ischemic infarcts involving the cortical subcortical right frontal lobe as well as the bilateral cerebellar hemispheres as above. Findings likely embolic in nature. No associated hemorrhage. 2. No other acute intracranial abnormality. 3. Mildly advanced cerebral atrophy with chronic small vessel ischemic disease. 4. Diffuse ventriculomegaly. While this finding may be related to underlying global atrophy, a component of NPH may be contributory, and could be considered in the correct clinical setting. Electronically Signed   By: Rise Mu M.D.   On: 03/14/2018 05:57   Vas US Carotid (at [facility])  Result Date: 03/14/2018 Carotid Arterial Duplex Study Indications: CVA. Performing Technologist:  Chanda Busing RVT  Examination Guidelines: A complete evaluation includes B-mode imaging, spectral Doppler, color Doppler, and power Doppler as needed of all accessible portions of each vessel. Bilateral testing is considered an integral part of a complete examination. Limited examinations for reoccurring indications may be performed as noted.  Right Carotid Findings: +----------+--------+-------+--------+----------------------+------------------+           PSV cm/sEDV    StenosisDescribe              Comments                             cm/s                                                    +----------+--------+-------+--------+----------------------+------------------+ CCA Prox  120     15                                                      +----------+--------+-------+--------+----------------------+------------------+ CCA Distal70      10                                   intimal thickening +----------+--------+-------+--------+----------------------+------------------+ ICA Prox  98      19             smooth and  heterogenous                             +----------+--------+-------+--------+----------------------+------------------+ ICA Distal75      10                                   tortuous           +----------+--------+-------+--------+----------------------+------------------+ ECA       65      0                                                       +----------+--------+-------+--------+----------------------+------------------+ +----------+--------+-------+--------+-------------------+           PSV cm/sEDV cmsDescribeArm Pressure (mmHG) +----------+--------+-------+--------+-------------------+ Subclavian114                                        +----------+--------+-------+--------+-------------------+ +---------+--------+--+--------+--+---------+ VertebralPSV  cm/s64EDV cm/s13Antegrade +---------+--------+--+--------+--+---------+  Left Carotid Findings: +----------+--------+-------+--------+----------------------+------------------+           PSV cm/sEDV    StenosisDescribe              Comments                             cm/s                                                    +----------+--------+-------+--------+----------------------+------------------+ CCA Prox  98      12                                   intimal thickening +----------+--------+-------+--------+----------------------+------------------+ CCA Distal70      10                                   intimal thickening +----------+--------+-------+--------+----------------------+------------------+ ICA Prox  67      8              smooth and                                                                heterogenous                             +----------+--------+-------+--------+----------------------+------------------+ ICA Distal78      9                                    tortuous           +----------+--------+-------+--------+----------------------+------------------+ ECA  85      0                                                       +----------+--------+-------+--------+----------------------+------------------+ +----------+--------+--------+--------+-------------------+ SubclavianPSV cm/sEDV cm/sDescribeArm Pressure (mmHG) +----------+--------+--------+--------+-------------------+           150                                         +----------+--------+--------+--------+-------------------+ +---------+--------+--+--------+--+---------+ VertebralPSV cm/s54EDV cm/s12Antegrade +---------+--------+--+--------+--+---------+  Summary: Right Carotid: Velocities in the right ICA are consistent with a 1-39% stenosis. Left Carotid: Velocities in the left ICA are consistent with a 1-39% stenosis. Vertebrals: Bilateral  vertebral arteries demonstrate antegrade flow. *See table(s) above for measurements and observations.  Electronically signed by Delia Heady MD on 03/14/2018 at 2:02:28 PM.    Final     12-lead ECG sinus rhythm (personally reviewed) All prior EKG's in EPIC reviewed with no documented atrial fibrillation  Telemetry sinus rhythm, brief runs PAT (personally reviewed)  Assessment and Plan:  1. Cryptogenic stroke The patient presents with cryptogenic stroke. TEE with small ASD, LE venous dopplers negative for DVT.  ILR has been recommended.  With PAT on telemetry, I think best next step would be to proceed with outpatient event monitoring. I will arrange event monitor placement through our office. Follow up with me in 6 weeks.   Dr Johney Frame to see later today.  Gypsy Balsam, NP 03/18/2018 12:12 PM  I have seen, examined the patient, and reviewed the above assessment and plan.  Changes to above are made where necessary.  On exam, RRR.  Very pleasant elderly female with dementia.  I would advise 30 day monitor.  I do not feel that she is a good candidate for our long term monitoring program with an implanted loop recorder.  OK to discharge from EP standpoint.  Electrophysiology team to see as needed while here. Please call with questions.   Co Sign: Hillis Range, MD 03/18/2018 4:47 PM

## 2018-03-18 NOTE — Plan of Care (Signed)
Adequate for discharge.

## 2018-03-18 NOTE — Progress Notes (Signed)
STROKE TEAM PROGRESS NOTE   SUBJECTIVE (INTERVAL HISTORY) No family is at the bedside.  Pt sitting lying in chair. Had LE venous doppler showed no DVT. EP recommend 30 day cardiac event monitoring instead of loop recorder.   OBJECTIVE Vitals:   03/18/18 0801 03/18/18 1149 03/18/18 1551 03/18/18 2021  BP: (!) 156/64 (!) 156/57 (!) 152/59 (!) 144/61  Pulse: 67 75 77 79  Resp: 18 16 20 18   Temp: (!) 97.4 F (36.3 C) 97.6 F (36.4 C) (!) 97.3 F (36.3 C) 97.8 F (36.6 C)  TempSrc: Oral Oral Oral Oral  SpO2: 95% 94% 95% 98%  Weight:      Height:        CBC:  Recent Labs  Lab 03/13/18 1806 03/14/18 0531  WBC 11.4* 9.1  NEUTROABS 7.5  --   HGB 12.8 11.4*  HCT 38.9 34.3*  MCV 96.3 94.0  PLT 312 223    Basic Metabolic Panel:  Recent Labs  Lab 03/13/18 1806 03/14/18 0531  NA 138 139  K 4.4 3.7  CL 106 110  CO2 23 24  GLUCOSE 109* 96  BUN 19 12  CREATININE 0.98 0.76  CALCIUM 9.1 8.3*    Lipid Panel:     Component Value Date/Time   CHOL 184 03/15/2018 0434   TRIG 94 03/15/2018 0434   HDL 36 (L) 03/15/2018 0434   CHOLHDL 5.1 03/15/2018 0434   VLDL 19 03/15/2018 0434   LDLCALC 129 (H) 03/15/2018 0434   HgbA1c:  Lab Results  Component Value Date   HGBA1C 5.0 03/15/2018   Urine Drug Screen: No results found for: LABOPIA, COCAINSCRNUR, LABBENZ, AMPHETMU, THCU, LABBARB  Alcohol Level No results found for: ETH     IMAGING  Ct Angio Head W Or Wo Contrast 03/15/2018 IMPRESSION:   CT HEAD:  1. No acute intracranial process; no acute infarcts not apparent by CT.  2. Stable examination including disproportionate mesial temporal lobe atrophy associated with neuro degenerative syndromes.   CTA NECK:  1. No hemodynamically significant stenosis ICA's.  2. Moderate stenosis LEFT vertebral artery origin, patent vertebral arteries.  3. RIGHT upper lobe pneumonia.   CTA HEAD:  1. No emergent large vessel occlusion. No cerebral artery flow-limiting stenosis.  2.  Moderate stenosis RIGHT ICA.   Emphysema (ICD10-J43.9).    Aortic Atherosclerosis (ICD10-I70.0).    Ct Head Wo Contrast 03/13/2018 IMPRESSION:  1. No acute abnormality of the head or cervical spine.  2. Chronic small vessel ischemia and cerebral volume loss.    Mr Brain Wo Contrast 03/14/2018 IMPRESSION:  1. Few scattered patchy small volume acute to early subacute ischemic infarcts involving the cortical subcortical right frontal lobe as well as the bilateral cerebellar hemispheres as above. Findings likely embolic in nature. No associated hemorrhage.  2. No other acute intracranial abnormality.  3. Mildly advanced cerebral atrophy with chronic small vessel ischemic disease.  4. Diffuse ventriculomegaly. While this finding may be related to underlying global atrophy, a component of NPH may be contributory, and could be considered in the correct clinical setting.   Vas US Carotid (at [facility]) 03/14/2018 Summary:  Right Carotid: Velocities in the right ICA are consistent with a 1-39% stenosis.  Left Carotid: Velocities in the left ICA are consistent with a 1-39% stenosis.  Vertebrals: Bilateral vertebral arteries demonstrate antegrade flow.    Transthoracic Echocardiogram  03/14/2018 Study Conclusions - Left ventricle: The cavity size was normal. Wall thickness was   normal. Systolic function was  normal. The estimated ejection   fraction was in the range of 60% to 65%. Wall motion was normal;   there were no regional wall motion abnormalities. Doppler   parameters are consistent with abnormal left ventricular   relaxation (grade 1 diastolic dysfunction). - Aortic valve: There was no stenosis. There was trivial   regurgitation. - Mitral valve: Moderately calcified annulus. There appears to be   mobile calcification on the lateral annulus. - Left atrium: The atrium was mildly dilated. - Right ventricle: The cavity size was normal. Systolic function   was normal. -  Pulmonary arteries: No complete TR doppler jet so unable to   estimate PA systolic pressure. - Inferior vena cava: The vessel was normal in size. The   respirophasic diameter changes were in the normal range (>= 50%),   consistent with normal central venous pressure. Impressions: - Normal LV size with EF 60-65%. Normal RV size and systolic   function. There is moderate mitral annular calcification with the   appearance of mobile calcification on the lateral annulus. Given   history of CVA, would evaluate this with TEE.  TEE - Left ventricle: Systolic function was normal. The estimated ejection fraction was in the range of 60% to 65%. Wall motion was normal; there were no regional wall motion abnormalities. - Aortic valve: There was trivial regurgitation. - Mitral valve: Severely calcified posterior annulus. Mildly thickened leaflets. - Left atrium: No evidence of thrombus in the atrial cavity or appendage. No evidence of thrombus in the atrial cavity or appendage. No evidence of thrombus in the atrial cavity or appendage. - Right atrium: No evidence of thrombus in the atrial cavity or appendage. - Atrial septum: There was an atrial septal aneurysm.  Impressions:  - Interatrial septum is aneurysmal. There is a small left to right flow in the posterior portion of the septum consistent with a very small septum secundum ASD. Bubble study is positive.   PHYSICAL EXAM  Temp:  [97.3 F (36.3 C)-98.5 F (36.9 C)] 97.8 F (36.6 C) (11/05 2021) Pulse Rate:  [64-79] 79 (11/05 2021) Resp:  [16-20] 18 (11/05 2021) BP: (136-156)/(52-64) 144/61 (11/05 2021) SpO2:  [94 %-98 %] 98 % (11/05 2021)  General - Well nourished, well developed, in no apparent distress.  Ophthalmologic - fundi not visualized due to noncooperation.  Cardiovascular - Regular rate and rhythm.  Mental Status -  Level of arousal and orientation to place was intact, but not orientated to time  or situation. Language including expression, repetition, comprehension was assessed and found intact. Naming 3/4  Fund of knowledge impaired.   Cranial Nerves II - XII - II - Visual field intact OU. III, IV, VI - Extraocular movements intact. V - Facial sensation intact bilaterally. VII - Facial movement intact bilaterally. VIII - Hearing & vestibular intact bilaterally. X - Palate elevates symmetrically. XI - Chin turning & shoulder shrug intact bilaterally. XII - Tongue protrusion intact.  Motor Strength - The patient's strength was normal in all extremities and pronator drift was absent.  Bulk was normal and fasciculations were absent.   Motor Tone - Muscle tone was assessed at the neck and appendages and was normal.  Reflexes - The patient's reflexes were symmetrical in all extremities and she had no pathological reflexes.  Sensory - Light touch, temperature/pinprick were assessed and were symmetrical.    Coordination - The patient had normal movements in the hands with no ataxia or dysmetria.  Tremor was absent.  Gait and Station -  deferred.    ASSESSMENT/PLAN Ms. Jahzaria Vary is a 83 y.o. female with history of Htn and dementia presenting with AMS, recent fall, lethargy, and leans to the right. She did not receive IV t-PA due to late presentation.  Strokes: bilateral cerebellum and left frontal small and punctate infarcts - embolic pattern -likely due to mobile MV calcification found on TTE  Resultant - disorientation  CT head  - no acute findings.  MRI head - Few scattered patchy small volume acute to early subacute ischemic infarcts involving the cortical subcortical right frontal lobe as well as the bilateral cerebellar hemispheres  CTA H&N - Moderate stenosis Lt VA origin and Rt intracranial ICA.  Carotid Doppler - unremarkable  2D Echo  - EF 60 - 65%. Mitral annular calcification with possible mobile calcification.   TEE showed severely calcified posterior  annulus with mildly thickened leaflets, no mentioning of mobile calcification.  Patient does have a small ASD with ASA.  Positive bubble study.  LE venous Doppler no DVT  Agree with EP recommendation for 30-day cardiac event monitoring as outpatient to rule out A. fib  LDL - 129  HgbA1c - 5.0  VTE prophylaxis - Lovenox  Diet -  - Heart healthy with thin liquids.  aspirin 81 mg daily prior to admission, now on aspirin 325 mg daily. Recommend ASA 81 and plavix 75 for 3 weeks and then plavix alone.   Patient counseled to be compliant with her antithrombotic medications  Ongoing aggressive stroke risk factor management  Therapy recommendations:  SNF  Disposition:  Pending  MV calcification  TTE concerning for MV mobile calcification  TEE showed severely calcified MV annulus, no mentioning of mobile calcification  On DAPT now  Hypertension  Stable . 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 Please call with questions. Pt will follow up with stroke clinic NP at [facility] in about 4 weeks. Thanks for the consult.   Marvel Plan, MD PhD Stroke Neurology 03/18/2018 8:25 PM     To contact Stroke Continuity provider, please refer to [email or web address]. After hours, contact General Neurology

## 2018-03-18 NOTE — Anesthesia Postprocedure Evaluation (Signed)
Anesthesia Post Note  Patient: Nina Bell  Procedure(s) Performed: TRANSESOPHAGEAL ECHOCARDIOGRAM (TEE) (N/A ) BUBBLE STUDY     Patient location during evaluation: PACU Anesthesia Type: MAC Level of consciousness: awake and alert Pain management: pain level controlled Vital Signs Assessment: post-procedure vital signs reviewed and stable Respiratory status: spontaneous breathing, nonlabored ventilation and respiratory function stable Cardiovascular status: blood pressure returned to baseline and stable Postop Assessment: no apparent nausea or vomiting Anesthetic complications: no    Last Vitals:  Vitals:   03/17/18 2354 03/18/18 0317  BP: 136/63 (!) 149/52  Pulse: 70 64  Resp: 18 18  Temp: 36.9 C 36.6 C  SpO2: 94% 94%    Last Pain:  Vitals:   03/18/18 0317  TempSrc: Oral  PainSc:                  Brennan Bailey

## 2018-03-18 NOTE — Discharge Summary (Addendum)
Physician Discharge Summary  Nina Bell ZOX:[id number] DOB: 03/07/1935 DOA: 03/13/2018  PCP: Nina Jenny, MD  Admit date: 03/13/2018 Discharge date: 03/19/2018  Time spent: 45 minutes  Recommendations for Outpatient Follow-up:  1. [facility] in 4 weeks 2. EP follow-up for EVent monitor   Discharge Diagnoses:  Embolic stroke Small ASD Stage II pressure ulcer right buttock   Hypertension   Dementia (HCC)   CVA (cerebral vascular accident) (HCC)   AMS (altered mental status)   Discharge Condition: Stable  Diet recommendation: Heart healthy  Filed Weights   03/13/18 1637 03/14/18 0100 03/14/18 0458  Weight: 65.8 kg 59 kg 59 kg    History of present illness:  Nina Hauseris an 83 y.o.femalefrom ALF with medical history significantfor Dementia, HTN, and arthritiswho presented to the ED with change in mental status over the last 2 days. Daughter noticed that patient has been leaning more towards the right and has been very somnolent over the last day. She has also noticed a change in facial movement as if the patient is constantly "puckering" her lips.  Hospital Course:   Multifocal strokes - suspected to be embolic in origin, MRI noted scattered patchy acute to subacute ischemic infarcts involving subcortical right frontal lobe and bilateral cerebellar hemispheres -no Significant deficits at this time -CTA head and neck with moderate stenosis left vertebral artery origin and right intracranial ICA -Neurology consulted, recommended TEE, loop recorder, aspirin 81 mg daily and Plavix 75 mg daily for 3 weeks followed by Plavix alone -2D echocardiogram with mitral valve calcification, ? mobile, preserved ejection fraction,  subsequently TEE was completed which only noted a calcified mitral valve, no thrombus in the atrial cavity or left atrial appendage, preserved EF, also noted atrial septal defect. -Due to ASD, venous duplex was completed to rule out  paradoxical embolus, duplex study was negative for DVT, cardiology/EP consulted for loop recorder, recommended outpatient Event monitor which they will set up, due to PAT on the monitor. -Carotid duplex with no significant stenosis -LDL is 129-added statin, HbA1c is 5.0 -PT/OT evaluations completed, SNF/supervision recommended --She will be discharged to SNF  for rehabilitation  Mild dementia -Stable, from assisted living facility  Hypertension -Stable, permissive hypertension   Stage II pressure ulcer R buttock -Present prior to admission, recommend supportive care  Code Status: DNR  Procedures:  -Trans-esophageal echocardiogram -Impressions: -preserved EF of 60% - Interatrial septum is aneurysmal. There is a small left to right   flow in the posterior portion of the septum consistent with a   very small septum secundum ASD. Bubble study is positive.  Venous duplex: Negative for DVT  Consultations:  Neurology  Cardiology  Discharge Exam: Vitals:   03/19/18 0333 03/19/18 0758  BP: (!) 133/58 (!) 149/49  Pulse: 73 76  Resp: 18 16  Temp: 98 F (36.7 C) 97.7 F (36.5 C)  SpO2: 98% 95%    General: AAOx3 Cardiovascular: S1S2/RRR Respiratory: CTAB  Discharge Instructions   Discharge Instructions    Ambulatory referral to Neurology   Complete by:  As directed    Follow up with stroke clinic NP (Nina Bell or Nina Bell, if both not available, consider Nina Bell, or Nina Bell) at [facility] in about 4 weeks. Thanks.   Diet - low sodium heart healthy   Complete by:  As directed    Increase activity slowly   Complete by:  As directed      Allergies as of 03/19/2018      Reactions   Penicillins Other (  See Comments)   unknown      Medication List    TAKE these medications   acetaminophen 325 MG tablet Commonly known as:  TYLENOL Take 650 mg by mouth daily as needed for mild pain.   aspirin 81 MG chewable tablet Chew 81 mg by mouth daily.    atenolol 50 MG tablet Commonly known as:  TENORMIN Take 50 mg by mouth daily.   atorvastatin 40 MG tablet Commonly known as:  LIPITOR Take 1 tablet (40 mg total) by mouth daily at 6 PM.   clopidogrel 75 MG tablet Commonly known as:  PLAVIX Take 1 tablet (75 mg total) by mouth daily.   losartan 50 MG tablet Commonly known as:  COZAAR Take 50 mg by mouth daily.   multivitamin with minerals Tabs tablet Take 1 tablet by mouth daily.   psyllium 0.52 g capsule Commonly known as:  REGULOID Take 0.52 g by mouth at bedtime.      Allergies  Allergen Reactions  . Penicillins Other (See Comments)    unknown    Contact information for follow-up providers    Nina Lente, NP Follow up on 05/15/2018.   Specialty:  Cardiology Why:  at 8:40AM Contact information: [address] [place] [address] [phone]        [facility]. Schedule an appointment as soon as possible for a visit in 4 week(s).   Specialty:  Neurology Contact information: [address] [place] [address] [phone]           Contact information for after-discharge care    Destination    [facility]erred [facility] .   Service:  Skilled Nursing Contact information: [address] [place] [address] [phone]                   The results of significant diagnostics from this hospitalization (including imaging, microbiology, ancillary and laboratory) are listed below for reference.    Significant Diagnostic Studies: Ct Angio Head W Or Wo Contrast  Result Date: 03/15/2018 CLINICAL DATA:  Stroke follow-up. History of hypertension and dementia. EXAM: CT ANGIOGRAPHY HEAD AND NECK TECHNIQUE: Multidetector CT imaging of the head and neck was performed using the standard protocol during bolus administration of intravenous contrast. Multiplanar CT image reconstructions and MIPs were obtained to  evaluate the vascular anatomy. Carotid stenosis measurements (when applicable) are obtained utilizing NASCET criteria, using the distal internal carotid diameter as the denominator. CONTRAST:  50mL ISOVUE-370 IOPAMIDOL (ISOVUE-370) INJECTION 76% COMPARISON:  CT HEAD March 13, 2018 and MRI head March 14, 2018 FINDINGS: CT HEAD FINDINGS BRAIN: No intraparenchymal hemorrhage, mass effect nor midline shift. Moderate to severe ventriculomegaly with disproportionate mesial temporal lobe atrophy and ex vacuo dilatation temporal horns. Patchy supratentorial white matter hypodensities compatible with moderate chronic small vessel ischemic changes, unchanged. No acute large vascular territory infarcts. No abnormal extra-axial fluid collections. Basal cisterns are patent. VASCULAR: Moderate calcific atherosclerosis of the carotid siphons. SKULL: No skull fracture. No significant scalp soft tissue swelling. SINUSES/ORBITS: Small LEFT maxillary sinus mucosal retention cyst. Mastoid air cells are well aerated.The included ocular globes and orbital contents are non-suspicious. Status post bilateral ocular lens implants. OTHER: None. CTA NECK FINDINGS: AORTIC ARCH: Normal appearance of the thoracic arch, normal branch pattern. Moderate calcific atherosclerosis aortic arch the origins of the innominate, left Common carotid artery and subclavian artery are patent. Mild stenosis LEFT common carotid artery and LEFT subclavian  artery origins. RIGHT CAROTID SYSTEM: Common carotid artery is patent. Mild calcific atherosclerosis of the carotid bifurcation without hemodynamically significant stenosis by NASCET criteria. Normal appearance of the internal carotid artery. LEFT CAROTID SYSTEM: Common carotid artery is patent. Mild calcific atherosclerosis of the carotid bifurcation without hemodynamically significant stenosis by NASCET criteria. Normal appearance of the internal carotid artery. VERTEBRAL ARTERIES:Patent codominant vertebral  arteries. Moderate stenosis LEFT vertebral artery origin. SKELETON: No acute osseous process though bone windows have not been submitted. Old manubrial fracture. OTHER NECK: Soft tissues of the neck are nonacute though, not tailored for evaluation. 11 mm RIGHT thyroid nodule, no routine indicated follow-up by consensus. UPPER CHEST: Patchy RIGHT upper lobe subpleural consolidation. Mild centrilobular emphysema. No superior mediastinal lymphadenopathy. CTA HEAD FINDINGS: ANTERIOR CIRCULATION: Patent cervical internal carotid arteries, petrous, cavernous and supra clinoid internal carotid arteries. Moderate stenosis RIGHT cavernous ICA. 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 Patent anterior communicating artery. Patent anterior and middle cerebral arteries. No large vessel occlusion, significant stenosis, contrast extravasation. POSTERIOR CIRCULATION: Patent vertebral arteries, vertebrobasilar junction and basilar artery, as well as main branch vessels. Patent posterior cerebral arteries, mild luminal irregularity compatible with atherosclerosis. No large vessel occlusion, significant stenosis, contrast extravasation or aneurysm. VENOUS SINUSES: Major dural venous sinuses are patent though not tailored for evaluation on this angiographic examination. ANATOMIC VARIANTS: None. DELAYED PHASE: Moderately motion degraded series, no definite abnormal intracranial enhancement. MIP images reviewed. IMPRESSION: CT HEAD: 1. No acute intracranial process; no acute infarcts not apparent by CT. 2. Stable examination including disproportionate mesial temporal lobe atrophy associated with neuro degenerative syndromes. CTA NECK: 1. No hemodynamically significant stenosis ICA's. 2. Moderate stenosis LEFT vertebral artery origin, patent vertebral arteries. 3. RIGHT upper lobe pneumonia. CTA HEAD: 1. No emergent large vessel occlusion. No cerebral artery flow-limiting stenosis. 2. Moderate stenosis RIGHT ICA. Emphysema  (ICD10-J43.9).  Aortic Atherosclerosis (ICD10-I70.0). Electronically Signed   By: Awilda Metro M.D.   On: 03/15/2018 00:15   Dg Chest 1 View  Result Date: 03/13/2018 CLINICAL DATA:  Altered mental status, fell yesterday, disorientation EXAM: CHEST  1 VIEW COMPARISON:  None. FINDINGS: No active infiltrate or effusion is seen. Mediastinal and hilar contours are unremarkable. The heart is mildly enlarged. No bony abnormality is seen. A small hiatal hernia cannot be excluded. IMPRESSION: 1. No active lung disease. 2. Borderline cardiomegaly. Electronically Signed   By: Dwyane Dee M.D.   On: 03/13/2018 17:04   Ct Head Wo Contrast  Result Date: 03/13/2018 CLINICAL DATA:  Fall EXAM: CT HEAD WITHOUT CONTRAST CT CERVICAL SPINE WITHOUT CONTRAST TECHNIQUE: Multidetector CT imaging of the head and cervical spine was performed following the standard protocol without intravenous contrast. Multiplanar CT image reconstructions of the cervical spine were also generated. COMPARISON:  None. FINDINGS: CT HEAD FINDINGS Brain: There is no mass, hemorrhage or extra-axial collection. There is generalized atrophy without lobar predilection. There is hypoattenuation of the periventricular white matter, most commonly indicating chronic ischemic microangiopathy. Vascular: Atherosclerotic calcification of the internal carotid arteries at the skull base. No abnormal hyperdensity of the major intracranial arteries or dural venous sinuses. Skull: The visualized skull base, calvarium and extracranial soft tissues are normal. Sinuses/Orbits: No fluid levels or advanced mucosal thickening of the visualized paranasal sinuses. No mastoid or middle ear effusion. The orbits are normal. CT CERVICAL SPINE FINDINGS Alignment: No static subluxation. Facets are aligned. Occipital condyles are normally positioned. Skull base and vertebrae: No acute fracture. Soft tissues and spinal canal: No prevertebral fluid or swelling.  No visible canal  hematoma. Disc levels: No advanced spinal canal or neural foraminal stenosis. Upper chest: No pneumothorax, pulmonary nodule or pleural effusion. Other: Normal visualized paraspinal cervical soft tissues. IMPRESSION: 1. No acute abnormality of the head or cervical spine. 2. Chronic small vessel ischemia and cerebral volume loss. Electronically Signed   By: Deatra Robinson M.D.   On: 03/13/2018 17:58   Ct Angio Neck W Or Wo Contrast  Result Date: 03/15/2018 CLINICAL DATA:  Stroke follow-up. History of hypertension and dementia. EXAM: CT ANGIOGRAPHY HEAD AND NECK TECHNIQUE: Multidetector CT imaging of the head and neck was performed using the standard protocol during bolus administration of intravenous contrast. Multiplanar CT image reconstructions and MIPs were obtained to evaluate the vascular anatomy. Carotid stenosis measurements (when applicable) are obtained utilizing NASCET criteria, using the distal internal carotid diameter as the denominator. CONTRAST:  50mL ISOVUE-370 IOPAMIDOL (ISOVUE-370) INJECTION 76% COMPARISON:  CT HEAD March 13, 2018 and MRI head March 14, 2018 FINDINGS: CT HEAD FINDINGS BRAIN: No intraparenchymal hemorrhage, mass effect nor midline shift. Moderate to severe ventriculomegaly with disproportionate mesial temporal lobe atrophy and ex vacuo dilatation temporal horns. Patchy supratentorial white matter hypodensities compatible with moderate chronic small vessel ischemic changes, unchanged. No acute large vascular territory infarcts. No abnormal extra-axial fluid collections. Basal cisterns are patent. VASCULAR: Moderate calcific atherosclerosis of the carotid siphons. SKULL: No skull fracture. No significant scalp soft tissue swelling. SINUSES/ORBITS: Small LEFT maxillary sinus mucosal retention cyst. Mastoid air cells are well aerated.The included ocular globes and orbital contents are non-suspicious. Status post bilateral ocular lens implants. OTHER: None. CTA NECK FINDINGS:  AORTIC ARCH: Normal appearance of the thoracic arch, normal branch pattern. Moderate calcific atherosclerosis aortic arch the origins of the innominate, left Common carotid artery and subclavian artery are patent. Mild stenosis LEFT common carotid artery and LEFT subclavian artery origins. RIGHT CAROTID SYSTEM: Common carotid artery is patent. Mild calcific atherosclerosis of the carotid bifurcation without hemodynamically significant stenosis by NASCET criteria. Normal appearance of the internal carotid artery. LEFT CAROTID SYSTEM: Common carotid artery is patent. Mild calcific atherosclerosis of the carotid bifurcation without hemodynamically significant stenosis by NASCET criteria. Normal appearance of the internal carotid artery. VERTEBRAL ARTERIES:Patent codominant vertebral arteries. Moderate stenosis LEFT vertebral artery origin. SKELETON: No acute osseous process though bone windows have not been submitted. Old manubrial fracture. OTHER NECK: Soft tissues of the neck are nonacute though, not tailored for evaluation. 11 mm RIGHT thyroid nodule, no routine indicated follow-up by consensus. UPPER CHEST: Patchy RIGHT upper lobe subpleural consolidation. Mild centrilobular emphysema. No superior mediastinal lymphadenopathy. CTA HEAD FINDINGS: ANTERIOR CIRCULATION: Patent cervical internal carotid arteries, petrous, cavernous and supra clinoid internal carotid arteries. Moderate stenosis RIGHT cavernous ICA. 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 Patent anterior communicating artery. Patent anterior and middle cerebral arteries. No large vessel occlusion, significant stenosis, contrast extravasation. POSTERIOR CIRCULATION: Patent vertebral arteries, vertebrobasilar junction and basilar artery, as well as main branch vessels. Patent posterior cerebral arteries, mild luminal irregularity compatible with atherosclerosis. No large vessel occlusion, significant stenosis, contrast extravasation or aneurysm. VENOUS  SINUSES: Major dural venous sinuses are patent though not tailored for evaluation on this angiographic examination. ANATOMIC VARIANTS: None. DELAYED PHASE: Moderately motion degraded series, no definite abnormal intracranial enhancement. MIP images reviewed. IMPRESSION: CT HEAD: 1. No acute intracranial process; no acute infarcts not apparent by CT. 2. Stable examination including disproportionate mesial temporal lobe atrophy associated with neuro degenerative syndromes. CTA NECK: 1. No hemodynamically significant stenosis ICA's. 2.  Moderate stenosis LEFT vertebral artery origin, patent vertebral arteries. 3. RIGHT upper lobe pneumonia. CTA HEAD: 1. No emergent large vessel occlusion. No cerebral artery flow-limiting stenosis. 2. Moderate stenosis RIGHT ICA. Emphysema (ICD10-J43.9).  Aortic Atherosclerosis (ICD10-I70.0). Electronically Signed   By: Awilda Metro M.D.   On: 03/15/2018 00:15   Ct Cervical Spine Wo Contrast  Result Date: 03/13/2018 CLINICAL DATA:  Fall EXAM: CT HEAD WITHOUT CONTRAST CT CERVICAL SPINE WITHOUT CONTRAST TECHNIQUE: Multidetector CT imaging of the head and cervical spine was performed following the standard protocol without intravenous contrast. Multiplanar CT image reconstructions of the cervical spine were also generated. COMPARISON:  None. FINDINGS: CT HEAD FINDINGS Brain: There is no mass, hemorrhage or extra-axial collection. There is generalized atrophy without lobar predilection. There is hypoattenuation of the periventricular white matter, most commonly indicating chronic ischemic microangiopathy. Vascular: Atherosclerotic calcification of the internal carotid arteries at the skull base. No abnormal hyperdensity of the major intracranial arteries or dural venous sinuses. Skull: The visualized skull base, calvarium and extracranial soft tissues are normal. Sinuses/Orbits: No fluid levels or advanced mucosal thickening of the visualized paranasal sinuses. No mastoid or middle  ear effusion. The orbits are normal. CT CERVICAL SPINE FINDINGS Alignment: No static subluxation. Facets are aligned. Occipital condyles are normally positioned. Skull base and vertebrae: No acute fracture. Soft tissues and spinal canal: No prevertebral fluid or swelling. No visible canal hematoma. Disc levels: No advanced spinal canal or neural foraminal stenosis. Upper chest: No pneumothorax, pulmonary nodule or pleural effusion. Other: Normal visualized paraspinal cervical soft tissues. IMPRESSION: 1. No acute abnormality of the head or cervical spine. 2. Chronic small vessel ischemia and cerebral volume loss. Electronically Signed   By: Deatra Robinson M.D.   On: 03/13/2018 17:58   Mr Brain Wo Contrast  Result Date: 03/14/2018 CLINICAL DATA:  Initial evaluation for acute altered mental status. The EXAM: MRI HEAD WITHOUT CONTRAST TECHNIQUE: Multiplanar, multiecho pulse sequences of the brain and surrounding structures were obtained without intravenous contrast. COMPARISON:  Prior CT from 03/13/2018. FINDINGS: Brain: Examination mildly degraded by motion artifact. Diffuse prominence of the CSF containing spaces compatible with generalized cerebral atrophy, mildly advanced in nature. Confluent T2/FLAIR hyperintensity within the periventricular white matter most consistent with chronic small vessel ischemic disease, mild for age. There is minimal patchy diffusion abnormality involving the cortical and subcortical white matter within the right frontal lobe (series 5, image 81), consistent with small acute right MCA territory infarcts. A few additional punctate foci of diffusion abnormality within the bilateral cerebellar hemispheres (series 5, image 54, 53, 46), also consistent with acute/early subacute ischemic infarcts. Findings are likely embolic in nature. No associated hemorrhage or mass effect. No other evidence for acute or subacute ischemia. Gray-white matter differentiation otherwise maintained. No acute  or chronic intracranial hemorrhage. No mass lesion, midline shift or mass effect. Diffuse ventricular prominence most like related global parenchymal volume loss. A degree of underlying NPH would be difficult to exclude. No extra-axial fluid collection. Pituitary gland within normal limits. Vascular: Major intracranial vascular flow voids maintained. Skull and upper cervical spine: Craniocervical junction normal. Upper cervical spine within normal limits. Bone marrow signal intensity normal. No scalp soft tissue abnormality. Sinuses/Orbits: Patient status post ocular lens replacement bilaterally. Paranasal sinuses are clear. Trace opacity left mastoid air cells, of doubtful significance. Other: End. IMPRESSION: 1. Few scattered patchy small volume acute to early subacute ischemic infarcts involving the cortical subcortical right frontal lobe as well as the bilateral cerebellar hemispheres as above.  Findings likely embolic in nature. No associated hemorrhage. 2. No other acute intracranial abnormality. 3. Mildly advanced cerebral atrophy with chronic small vessel ischemic disease. 4. Diffuse ventriculomegaly. While this finding may be related to underlying global atrophy, a component of NPH may be contributory, and could be considered in the correct clinical setting. Electronically Signed   By: Rise Mu M.D.   On: 03/14/2018 05:57   Vas US Carotid (at [facility])  Result Date: 03/14/2018 Carotid Arterial Duplex Study Indications: CVA. Performing Technologist: Chanda Busing RVT  Examination Guidelines: A complete evaluation includes B-mode imaging, spectral Doppler, color Doppler, and power Doppler as needed of all accessible portions of each vessel. Bilateral testing is considered an integral part of a complete examination. Limited examinations for reoccurring indications may be performed as noted.  Right Carotid Findings:  +----------+--------+-------+--------+----------------------+------------------+           PSV cm/sEDV    StenosisDescribe              Comments                             cm/s                                                    +----------+--------+-------+--------+----------------------+------------------+ CCA Prox  120     15                                                      +----------+--------+-------+--------+----------------------+------------------+ CCA Distal70      10                                   intimal thickening +----------+--------+-------+--------+----------------------+------------------+ ICA Prox  98      19             smooth and                                                                heterogenous                             +----------+--------+-------+--------+----------------------+------------------+ ICA Distal75      10                                   tortuous           +----------+--------+-------+--------+----------------------+------------------+ ECA       65      0                                                       +----------+--------+-------+--------+----------------------+------------------+ +----------+--------+-------+--------+-------------------+  PSV cm/sEDV cmsDescribeArm Pressure (mmHG) +----------+--------+-------+--------+-------------------+ Subclavian114                                        +----------+--------+-------+--------+-------------------+ +---------+--------+--+--------+--+---------+ VertebralPSV cm/s64EDV cm/s13Antegrade +---------+--------+--+--------+--+---------+  Left Carotid Findings: +----------+--------+-------+--------+----------------------+------------------+           PSV cm/sEDV    StenosisDescribe              Comments                             cm/s                                                     +----------+--------+-------+--------+----------------------+------------------+ CCA Prox  98      12                                   intimal thickening +----------+--------+-------+--------+----------------------+------------------+ CCA Distal70      10                                   intimal thickening +----------+--------+-------+--------+----------------------+------------------+ ICA Prox  67      8              smooth and                                                                heterogenous                             +----------+--------+-------+--------+----------------------+------------------+ ICA Distal78      9                                    tortuous           +----------+--------+-------+--------+----------------------+------------------+ ECA       85      0                                                       +----------+--------+-------+--------+----------------------+------------------+ +----------+--------+--------+--------+-------------------+ SubclavianPSV cm/sEDV cm/sDescribeArm Pressure (mmHG) +----------+--------+--------+--------+-------------------+           150                                         +----------+--------+--------+--------+-------------------+ +---------+--------+--+--------+--+---------+ VertebralPSV cm/s54EDV cm/s12Antegrade +---------+--------+--+--------+--+---------+  Summary: Right Carotid: Velocities in the right ICA are consistent with a 1-39% stenosis. Left Carotid: Velocities in the left ICA are consistent with a 1-39% stenosis. Vertebrals: Bilateral vertebral arteries demonstrate  antegrade flow. *See table(s) above for measurements and observations.  Electronically signed by Delia Heady MD on 03/14/2018 at 2:02:28 PM.    Final    Vas [place] Lower Extremity Venous (dvt)  Result Date: 03/18/2018  Lower Venous Study Indications: Swelling.  Performing Technologist: Gertie Fey MHA, RDMS,  RVT, RDCS  Examination Guidelines: A complete evaluation includes B-mode imaging, spectral Doppler, color Doppler, and power Doppler as needed of all accessible portions of each vessel. Bilateral testing is considered an integral part of a complete examination. Limited examinations for reoccurring indications may be performed as noted.  Right Venous Findings: +---------+---------------+---------+-----------+----------+------------------+          CompressibilityPhasicitySpontaneityPropertiesSummary            +---------+---------------+---------+-----------+----------+------------------+ CFV                                                   Unable to evaluate +---------+---------------+---------+-----------+----------+------------------+ SFJ                                                   Unable to evaluate +---------+---------------+---------+-----------+----------+------------------+ FV Prox                          Yes                                     +---------+---------------+---------+-----------+----------+------------------+ FV Mid                           Yes                                     +---------+---------------+---------+-----------+----------+------------------+ FV Distal                        Yes                                     +---------+---------------+---------+-----------+----------+------------------+ POP                     Yes      Yes                                     +---------+---------------+---------+-----------+----------+------------------+ PTV                              Yes                                     +---------+---------------+---------+-----------+----------+------------------+ PERO                             Yes                                     +---------+---------------+---------+-----------+----------+------------------+  Patient unable to tolerate compression maneuvers  Left Venous  Findings: +---------+---------------+---------+-----------+----------+-------+          CompressibilityPhasicitySpontaneityPropertiesSummary +---------+---------------+---------+-----------+----------+-------+ CFV      Full                                                 +---------+---------------+---------+-----------+----------+-------+ SFJ      Full           Yes      Yes                          +---------+---------------+---------+-----------+----------+-------+ FV Prox                          Yes                          +---------+---------------+---------+-----------+----------+-------+ FV Mid                           Yes                          +---------+---------------+---------+-----------+----------+-------+ FV Distal                        Yes                          +---------+---------------+---------+-----------+----------+-------+ POP                     Yes      Yes                          +---------+---------------+---------+-----------+----------+-------+ PTV                              Yes                          +---------+---------------+---------+-----------+----------+-------+ PERO                             Yes                          +---------+---------------+---------+-----------+----------+-------+ Patient unable to tolerate most compression maneuvers    Summary: Right: There is no evidence of deep vein thrombosis in the lower extremity. However, portions of this examination were limited- see technologist comments above. Left: There is no evidence of deep vein thrombosis in the lower extremity. However, portions of this examination were limited- see technologist comments above.  *See table(s) above for measurements and observations. Electronically signed by Coral Else MD on 03/18/2018 at 6:54:37 PM.    Final     Microbiology: Recent Results (from the past 240 hour(s))  MRSA PCR Screening     Status: None    Collection Time: 03/14/18  2:08 AM  Result Value Ref Range Status   MRSA by PCR NEGATIVE NEGATIVE Final    Comment:        The GeneXpert MRSA Assay (FDA  approved for NASAL specimens only), is one component of a comprehensive MRSA colonization surveillance program. It is not intended to diagnose MRSA infection nor to guide or monitor treatment for MRSA infections. Performed at [facility], [address], [place], [place] [address]      Labs: Basic Metabolic Panel: Recent Labs  Lab 03/13/18 1806 03/14/18 0531  NA 138 139  K 4.4 3.7  CL 106 110  CO2 23 24  GLUCOSE 109* 96  BUN 19 12  CREATININE 0.98 0.76  CALCIUM 9.1 8.3*   Liver Function Tests: Recent Labs  Lab 03/13/18 1806  AST 19  ALT 18  ALKPHOS 65  BILITOT 0.5  PROT 7.0  ALBUMIN 3.5   No results for input(s): LIPASE, AMYLASE in the last 168 hours. No results for input(s): AMMONIA in the last 168 hours. CBC: Recent Labs  Lab 03/13/18 1806 03/14/18 0531  WBC 11.4* 9.1  NEUTROABS 7.5  --   HGB 12.8 11.4*  HCT 38.9 34.3*  MCV 96.3 94.0  PLT 312 223   Cardiac Enzymes: Recent Labs  Lab 03/13/18 2350 03/14/18 0531  TROPONINI 0.07* 0.06*   BNP: BNP (last 3 results) No results for input(s): BNP in the last 8760 hours.  ProBNP (last 3 results) No results for input(s): PROBNP in the last 8760 hours.  CBG: Recent Labs  Lab 03/13/18 1804  GLUCAP 99       Signed:  Zannie Cove MD.  [facility] 03/19/2018, 10:18 AM

## 2018-03-19 NOTE — Progress Notes (Signed)
Pt. Ready for discharge. Called and gave report to [facility]. Transported by [facility].

## 2018-03-19 NOTE — Clinical Social Work Placement (Signed)
Nurse to call report to [phone], Room 36      CLINICAL SOCIAL WORK PLACEMENT  NOTE  Date:  03/19/2018  Patient Details  Name: Nina Bell MRN: [id number] Date of Birth: May 07, 1935  Clinical Social Work is seeking post-discharge placement for this patient at the Skilled  Nursing Facility level of care (*CSW will initial, date and re-position this form in  chart as items are completed):  Yes   Patient/family provided with [place] Clinical Social Work Department's list of facilities offering this level of care within the geographic area requested by the patient (or if unable, by the patient's family).  Yes   Patient/family informed of their freedom to choose among providers that offer the needed level of care, that participate in Medicare, Medicaid or managed care program needed by the patient, have an available bed and are willing to accept the patient.  Yes   Patient/family informed of [facility]'s ownership interest in [facility] and [facility], as well as of the fact that they are under no obligation to receive care at these facilities.  PASRR submitted to EDS on       PASRR number received on       Existing PASRR number confirmed on       FL2 transmitted to all facilities in geographic area requested by pt/family on 03/17/18     FL2 transmitted to all facilities within larger geographic area on       Patient informed that his/her managed care company has contracts with or will negotiate with certain facilities, including the following:        Yes   Patient/family informed of bed offers received.  Patient chooses bed at [facility]     Physician recommends and patient chooses bed at      Patient to be transferred to [facility] on 03/19/18.  Patient to be transferred to facility by [facility]     Patient family notified on 03/19/18 of transfer.  Name of family member notified:  Amy     PHYSICIAN       Additional Comment:     _______________________________________________ Baldemar Lenis, LCSW 03/19/2018, 10:27 AM

## 2018-04-29 ENCOUNTER — Ambulatory Visit (INDEPENDENT_AMBULATORY_CARE_PROVIDER_SITE_OTHER): Payer: Medicare Other

## 2018-04-29 DIAGNOSIS — I639 Cerebral infarction, unspecified: Secondary | ICD-10-CM | POA: Diagnosis not present

## 2018-05-05 ENCOUNTER — Ambulatory Visit: Payer: Medicare Other | Admitting: Adult Health

## 2018-05-12 ENCOUNTER — Telehealth: Payer: Self-pay | Admitting: Nurse Practitioner

## 2018-05-12 NOTE — Telephone Encounter (Signed)
Per consult note, Dr Johney FrameAllred did not feel she was a good candidate for ILR with underlying dementia.  Would recommend leaving off for now.  If in the future her daughter would like to try again then we can.  We will look at results when they come back from the 4-5 days that she wore.  Gypsy BalsamAmber Seiler, NP 05/12/2018 3:12 PM

## 2018-05-12 NOTE — Telephone Encounter (Signed)
Left patient's daughter a message to call back. 12/30

## 2018-05-12 NOTE — Telephone Encounter (Signed)
I spoke to the patient's daughter (Amy) who informed me that her mother had a 30 day event monitor placed on 12/17 and has only wore it 4-5 days.  The patient has dementia and "continuously tears it off, nearly injuring herself."  The patient's daughter has decided not to put it on her again and would like further advisement.  Thank you.

## 2018-05-12 NOTE — Telephone Encounter (Signed)
New Message  Pt's daughter is calling, Glory BuffSeiler made pt appointment for 05/15/18 for evaluation of monitor results but daughter states the pt has dementia and keeps taking off the monitor so no results to report. Please call

## 2018-05-13 NOTE — Telephone Encounter (Signed)
Spoke to patient's daughter (Amy) and informed her of Nina Bell's recommendation.  She verbalized understanding and will return monitor.

## 2018-05-15 ENCOUNTER — Ambulatory Visit: Payer: Medicare Other | Admitting: Nurse Practitioner

## 2018-06-03 ENCOUNTER — Ambulatory Visit: Payer: Medicare Other | Admitting: Adult Health

## 2018-11-12 DEATH — deceased

## 2019-08-06 IMAGING — CT CT ANGIO NECK
1 of 12 series · 5 of 33 positions shown · IV contrast (iopamidol)
Comparison: CT HEAD March 13, 2018 and MRI head March 14, 2018

CLINICAL DATA: Stroke follow-up. History of hypertension and
dementia.

EXAM:
CT ANGIOGRAPHY HEAD AND NECK
TECHNIQUE: Multidetector CT imaging of the head and neck was performed using
the standard protocol during bolus administration of intravenous
contrast. Multiplanar CT image reconstructions and MIPs were
obtained to evaluate the vascular anatomy. Carotid stenosis
measurements (when applicable) are obtained utilizing NASCET
criteria, using the distal internal carotid diameter as the
denominator.
CONTRAST:  50mL 2HLQRR-UQW IOPAMIDOL (2HLQRR-UQW) INJECTION 76%

[Series 11: cta neck axial · axial · 0.39mm/px · z∈[+1154,+1376]mm · 5 of 333 slices shown]
[im 56/333  soft-tissue]
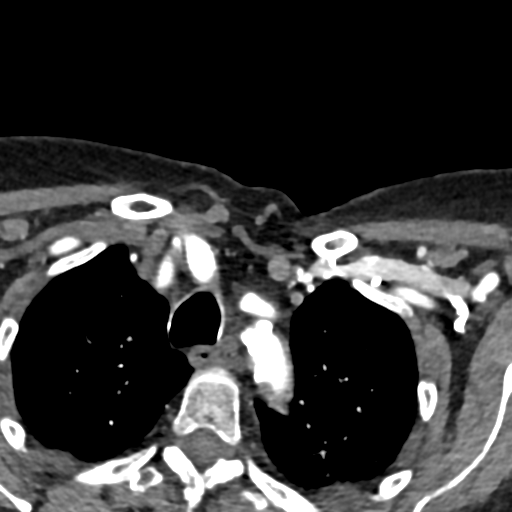
[im 111/333  bone]
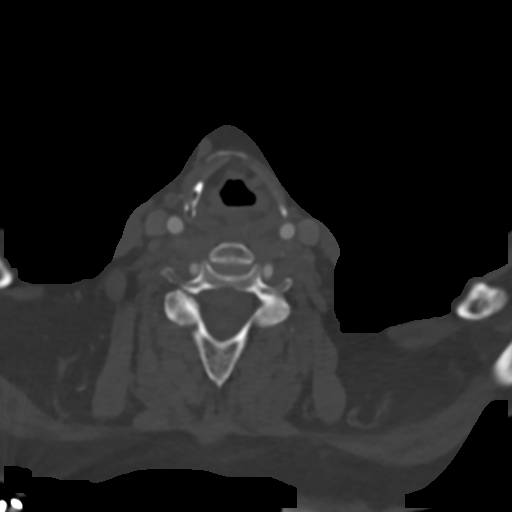
[im 167/333  soft-tissue]
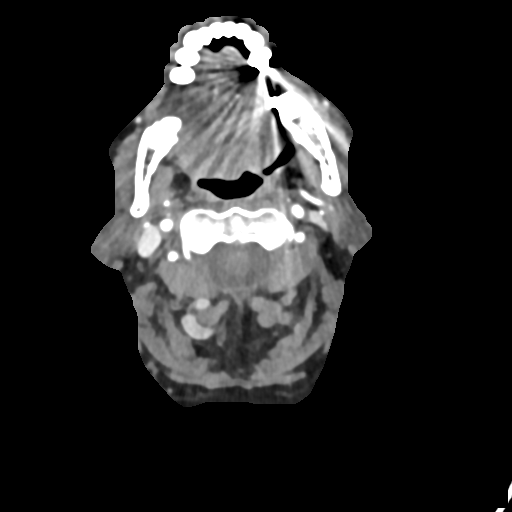
[im 222/333  bone]
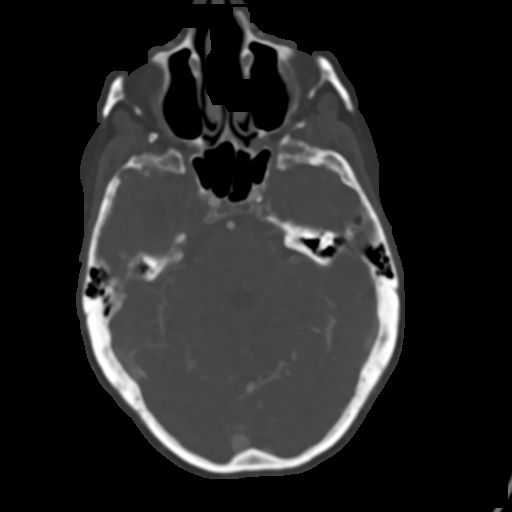
[im 277/333  soft-tissue]
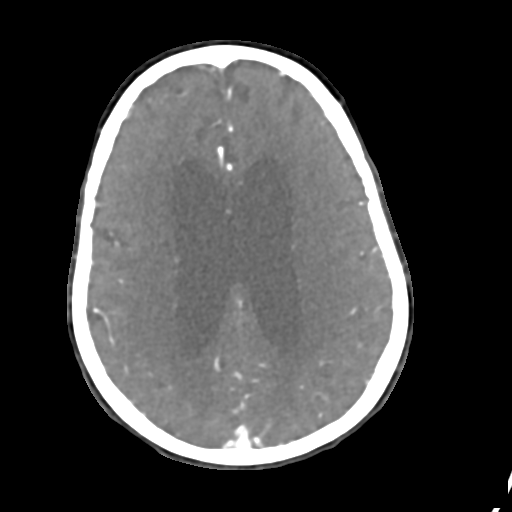

[5 of 33 positions shown; findings below may reference images not displayed]

FINDINGS: CT HEAD FINDINGS

BRAIN: No intraparenchymal hemorrhage, mass effect nor midline
shift. Moderate to severe ventriculomegaly with disproportionate
mesial temporal lobe atrophy and ex vacuo dilatation temporal horns.
Patchy supratentorial white matter hypodensities compatible with
moderate chronic small vessel ischemic changes, unchanged. No acute
large vascular territory infarcts. No abnormal extra-axial fluid
collections. Basal cisterns are patent.

VASCULAR: Moderate calcific atherosclerosis of the carotid siphons.

SKULL: No skull fracture. No significant scalp soft tissue swelling.

SINUSES/ORBITS: Small LEFT maxillary sinus mucosal retention cyst.
Mastoid air cells are well aerated.The included ocular globes and
orbital contents are non-suspicious. Status post bilateral ocular
lens implants.

OTHER: None.

CTA NECK FINDINGS:

AORTIC ARCH: Normal appearance of the thoracic arch, normal branch
pattern. Moderate calcific atherosclerosis aortic arch the origins
of the innominate, left Common carotid artery and subclavian artery
are patent. Mild stenosis LEFT common carotid artery and LEFT
subclavian artery origins.

RIGHT CAROTID SYSTEM: Common carotid artery is patent. Mild calcific
atherosclerosis of the carotid bifurcation without hemodynamically
significant stenosis by NASCET criteria. Normal appearance of the
internal carotid artery.

LEFT CAROTID SYSTEM: Common carotid artery is patent. Mild calcific
atherosclerosis of the carotid bifurcation without hemodynamically
significant stenosis by NASCET criteria. Normal appearance of the
internal carotid artery.

VERTEBRAL ARTERIES:Patent codominant vertebral arteries. Moderate
stenosis LEFT vertebral artery origin.

SKELETON: No acute osseous process though bone windows have not been
submitted. Old manubrial fracture.

OTHER NECK: Soft tissues of the neck are nonacute though, not
tailored for evaluation. 11 mm RIGHT thyroid nodule, no routine
indicated follow-up by consensus.

UPPER CHEST: Patchy RIGHT upper lobe subpleural consolidation. Mild
centrilobular emphysema. No superior mediastinal lymphadenopathy.

CTA HEAD FINDINGS:

ANTERIOR CIRCULATION: Patent cervical internal carotid arteries,
petrous, cavernous and supra clinoid internal carotid arteries.
Moderate stenosis RIGHT cavernous ICA. 2 mm medially directed RIGHT
PCOM aneurysm. Patent anterior communicating artery. Patent anterior
and middle cerebral arteries.

No large vessel occlusion, significant stenosis, contrast
extravasation.

POSTERIOR CIRCULATION: Patent vertebral arteries, vertebrobasilar
junction and basilar artery, as well as main branch vessels. Patent
posterior cerebral arteries, mild luminal irregularity compatible
with atherosclerosis.

No large vessel occlusion, significant stenosis, contrast
extravasation or aneurysm.

VENOUS SINUSES: Major dural venous sinuses are patent though not
tailored for evaluation on this angiographic examination.

ANATOMIC VARIANTS: None.

DELAYED PHASE: Moderately motion degraded series, no definite
abnormal intracranial enhancement.

MIP images reviewed.
IMPRESSION: CT HEAD:

1. No acute intracranial process; no acute infarcts not apparent by
CT.
2. Stable examination including disproportionate mesial temporal
lobe atrophy associated with neuro degenerative syndromes.

CTA NECK:

1. No hemodynamically significant stenosis ICA's.
2. Moderate stenosis LEFT vertebral artery origin, patent vertebral
arteries.
3. RIGHT upper lobe pneumonia.

CTA HEAD:

1. No emergent large vessel occlusion. No cerebral artery
flow-limiting stenosis.
2. Moderate stenosis RIGHT ICA.

Emphysema ([id number].H).  Aortic Atherosclerosis (I9GH2-541.1).
